# Patient Record
Sex: Female | Born: 2000 | Race: White | Hispanic: No | Marital: Single | Smoking: Never smoker
Health system: Southern US, Community
[De-identification: ages and names within clinical notes are randomized; demographics above are authoritative.]

## PROBLEM LIST (undated history)

## (undated) DIAGNOSIS — F909 Attention-deficit hyperactivity disorder, unspecified type: Secondary | ICD-10-CM

## (undated) HISTORY — PX: RECONSTRUCTION OF NOSE: SHX2301

## (undated) HISTORY — DX: Attention-deficit hyperactivity disorder, unspecified type: F90.9

## (undated) HISTORY — PX: TONSILLECTOMY AND ADENOIDECTOMY: SHX28

---
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
## 2020-04-15 ENCOUNTER — Emergency Department (HOSPITAL_COMMUNITY)
Admission: EM | Admit: 2020-04-15 | Discharge: 2020-04-16 | Disposition: A | Discharge status: Home / Self Care | Payer: Medicaid Other | Attending: Emergency Medicine | Admitting: Emergency Medicine

## 2020-04-15 ENCOUNTER — Encounter (HOSPITAL_COMMUNITY): Payer: Self-pay | Admitting: Emergency Medicine

## 2020-04-15 ENCOUNTER — Emergency Department (HOSPITAL_COMMUNITY): Payer: Medicaid Other

## 2020-04-15 ENCOUNTER — Other Ambulatory Visit: Payer: Self-pay

## 2020-04-15 DIAGNOSIS — Z5321 Procedure and treatment not carried out due to patient leaving prior to being seen by health care provider: Secondary | ICD-10-CM | POA: Insufficient documentation

## 2020-04-15 DIAGNOSIS — M79646 Pain in unspecified finger(s): Secondary | ICD-10-CM | POA: Diagnosis present

## 2020-04-15 NOTE — ED Triage Notes (Signed)
Pt states she got her thump stock in between to tables while working. Some deformity noticed on her thumb and bruise.

## 2020-04-16 ENCOUNTER — Other Ambulatory Visit: Payer: Self-pay

## 2020-04-16 ENCOUNTER — Emergency Department (HOSPITAL_BASED_OUTPATIENT_CLINIC_OR_DEPARTMENT_OTHER)
Admission: EM | Admit: 2020-04-16 | Discharge: 2020-04-16 | Disposition: A | Discharge status: Home / Self Care | Payer: Medicaid Other | Source: Home / Self Care | Attending: Emergency Medicine | Admitting: Emergency Medicine

## 2020-04-16 ENCOUNTER — Encounter (HOSPITAL_BASED_OUTPATIENT_CLINIC_OR_DEPARTMENT_OTHER): Payer: Self-pay

## 2020-04-16 DIAGNOSIS — S62524A Nondisplaced fracture of distal phalanx of right thumb, initial encounter for closed fracture: Secondary | ICD-10-CM

## 2020-04-16 DIAGNOSIS — W208XXA Other cause of strike by thrown, projected or falling object, initial encounter: Secondary | ICD-10-CM | POA: Insufficient documentation

## 2020-04-16 DIAGNOSIS — Y999 Unspecified external cause status: Secondary | ICD-10-CM | POA: Insufficient documentation

## 2020-04-16 DIAGNOSIS — Y9389 Activity, other specified: Secondary | ICD-10-CM | POA: Insufficient documentation

## 2020-04-16 DIAGNOSIS — Y929 Unspecified place or not applicable: Secondary | ICD-10-CM | POA: Insufficient documentation

## 2020-04-16 DIAGNOSIS — Z23 Encounter for immunization: Secondary | ICD-10-CM | POA: Insufficient documentation

## 2020-04-16 MED ORDER — BACITRACIN ZINC 500 UNIT/GM EX OINT
TOPICAL_OINTMENT | Freq: Two times a day (BID) | CUTANEOUS | Status: AC
  Administered 2020-04-16: 1 via TOPICAL
  Filled 2020-04-16: qty 28.35

## 2020-04-16 MED ORDER — IBUPROFEN 800 MG PO TABS
800.0000 mg | ORAL_TABLET | Freq: Once | ORAL | Status: AC
  Administered 2020-04-16: 10:00:00 800 mg via ORAL
  Filled 2020-04-16: qty 1

## 2020-04-16 MED ORDER — TETANUS-DIPHTH-ACELL PERTUSSIS 5-2.5-18.5 LF-MCG/0.5 IM SUSP
0.5000 mL | Freq: Once | INTRAMUSCULAR | Status: AC
  Administered 2020-04-16: 0.5 mL via INTRAMUSCULAR
  Filled 2020-04-16: qty 0.5

## 2020-04-16 NOTE — ED Provider Notes (Signed)
[facility] EMERGENCY DEPARTMENT Provider Note   CSN: [id number] Arrival date & time: 04/16/20  4166     History Chief Complaint  Patient presents with  . Hand Injury    Valerie Hughes is a 19 y.o. female.  19 year old female presents with complaint of right thumb injury. Patient states that she was at work yesterday setting up a table when the table fell and pinned her thumb between the table and the leg of the table. Injury occurred around 11:00 yesterday morning, states she was out of town so she waited to come to local ER on arrival back home last evening, extensive wait at [facility], ER and left after her x-ray was done. Patient reports pain improved with Coban splinting from the ER last night, is throbbing in nature, better with ibuprofen and elevating her hand. Patient is right-hand dominant. Reports pain and swelling to the distal thumb with small injury near her cuticle, last tetanus unknown. No other complaints or concerns today.        History reviewed. No pertinent past medical history.  There are no problems to display for this patient.   History reviewed. No pertinent surgical history.   OB History   No obstetric history on file.     No family history on file.  Social History   Tobacco Use  . Smoking status: Never Smoker  . Smokeless tobacco: Never Used  Substance Use Topics  . Alcohol use: Never  . Drug use: Never    Home Medications Prior to Admission medications   Not on File    Allergies    Patient has no known allergies.  Review of Systems   Review of Systems  Constitutional: Negative for fever.  Musculoskeletal: Positive for arthralgias, joint swelling and myalgias.  Skin: Positive for wound.  Allergic/Immunologic: Negative for immunocompromised state.  Neurological: Negative for weakness and numbness.  Hematological: Does not bruise/bleed easily.  Psychiatric/Behavioral: Negative for confusion.    Physical Exam Updated  Vital Signs BP 110/79 (BP Location: Left Arm)   Pulse 82   Temp 98 F (36.7 C) (Oral)   Resp 16   Ht 5\' 4"  (1.626 m)   Wt 61.2 kg   LMP 04/07/2020   SpO2 100%   BMI 23.17 kg/m   Physical Exam Vitals and nursing note reviewed.  Constitutional:      General: She is not in acute distress.    Appearance: She is well-developed. She is not diaphoretic.  HENT:     Head: Normocephalic and atraumatic.  Cardiovascular:     Pulses: Normal pulses.  Pulmonary:     Effort: Pulmonary effort is normal.  Musculoskeletal:        General: Swelling, tenderness and signs of injury present. No deformity.     Right hand: Swelling and tenderness present. No deformity. Decreased range of motion. Normal sensation. Normal capillary refill. Normal pulse.     Comments: Swelling and ecchymosis to distal right thumb with small defect/wound just proximal to the cuticle, no active bleeding. No nail bed injury.  Skin:    General: Skin is warm and dry.     Capillary Refill: Capillary refill takes less than 2 seconds.     Findings: Bruising present. No erythema or rash.  Neurological:     Mental Status: She is alert and oriented to person, place, and time.     Sensory: No sensory deficit.  Psychiatric:        Behavior: Behavior normal.  ED Results / Procedures / Treatments   Labs (all labs ordered are listed, but only abnormal results are displayed) Labs Reviewed - No data to display  EKG None  Radiology DG Hand Complete Right  Result Date: 04/15/2020 CLINICAL DATA:  Status post trauma. EXAM: RIGHT HAND - COMPLETE 3+ VIEW COMPARISON:  None. FINDINGS: An acute nondisplaced fracture deformity is seen involving the distal phalanx of the right thumb. There is no evidence of associated dislocation. There is no evidence of arthropathy or other focal bone abnormality. Mild soft tissue swelling is seen surrounding the previously noted fracture site. IMPRESSION: Acute fracture of the distal phalanx of the  right thumb. Electronically Signed   By: Aram Candela M.D.   On: 04/15/2020 22:15   DG Finger Thumb Right  Result Date: 04/15/2020 CLINICAL DATA:  Status post trauma. EXAM: RIGHT THUMB 2+V COMPARISON:  None. FINDINGS: A nondisplaced fracture deformity is seen extending through the proximal portion of the distal phalanx of the right thumb. There is no evidence of dislocation. There is no evidence of arthropathy or other focal bone abnormality. Mild diffuse soft tissue swelling is noted. IMPRESSION: Nondisplaced fracture of the distal phalanx of the right thumb. Electronically Signed   By: Aram Candela M.D.   On: 04/15/2020 22:14    Procedures Procedures (including critical care time)  Medications Ordered in ED Medications  bacitracin ointment (1 application Topical Provided for home use 04/16/20 1003)  ibuprofen (ADVIL) tablet 800 mg (800 mg Oral Given 04/16/20 1004)  Tdap (BOOSTRIX) injection 0.5 mL (0.5 mLs Intramuscular Given 04/16/20 1005)    ED Course  I have reviewed the triage vital signs and the nursing notes.  Pertinent labs & imaging results that were available during my care of the patient were reviewed by me and considered in my medical decision making (see chart for details).  Clinical Course as of Apr 16 1022  Sun Apr 16, 2020  8106 19 year old female with right thumb injury which occurred yesterday around 55 AM.  Patient had an x-ray at [facility], ED yesterday which showed acute nondisplaced fracture of the distal phalanx of the right thumb.  No nailbed injury, does have small skin defect just proximal to the cuticle which was cleaned and dressed with bacitracin.  Tetanus was updated, patient was placed in a thumb spica splint and referred to orthopedics for follow-up, recommend ice and elevate, take Motrin and Tylenol for pain.   [LM]    Clinical Course User Index [LM] Alden Hipp   MDM Rules/Calculators/A&P                     SPLINT  APPLICATION Date/Time: 10:23 AM Authorized by: Jeannie Fend Consent: Verbal consent obtained. Risks and benefits: risks, benefits and alternatives were discussed Consent given by: patient Splint applied by: ER tech Location details: right thumb Splint type: thumb spica Supplies used: OCL, ACE, webril Post-procedure: The splinted body part was neurovascularly unchanged following the procedure. Patient tolerance: Patient tolerated the procedure well with no immediate complications.    Final Clinical Impression(s) / ED Diagnoses Final diagnoses:  Closed nondisplaced fracture of distal phalanx of right thumb, initial encounter    Rx / DC Orders ED Discharge Orders    None       Jeannie Fend, PA-C 04/16/20 1023    Jacalyn Lefevre, MD 04/16/20 1120

## 2020-04-16 NOTE — ED Notes (Signed)
No answer x3

## 2020-04-16 NOTE — Discharge Instructions (Signed)
Keep splint on until follow up with orthopedics.  Elevate and apply ice for 20 minutes at a time at least three times daily. Take Motrin, up to 800mg , three times daily as needed for pain. You can also take Tylenol 650mg  four times daily as needed. Follow up with orthopedics, referral given, call Monday to schedule an appointment.

## 2020-04-16 NOTE — ED Triage Notes (Signed)
Pt arrives with c/o right thumb pain after getting her hand stuck between a wooden table and wooden rail yesterday. Pt reports she was seen at [facility] but left because of the wait. Pt reports she did have Xrays done there, hand wrapped in gauze and coban, also reports she needs her tetanus updated.

## 2020-04-16 NOTE — ED Notes (Signed)
No answer x2 

## 2020-04-16 NOTE — ED Notes (Signed)
No answer for vitals recheck x1 

## 2021-05-17 IMAGING — CR DG HAND COMPLETE 3+V*R*
3 series · 3 of 3 positions shown · non-contrast
Comparison: None.

CLINICAL DATA: Status post trauma.

EXAM:
RIGHT HAND - COMPLETE 3+ VIEW

[hand pa]
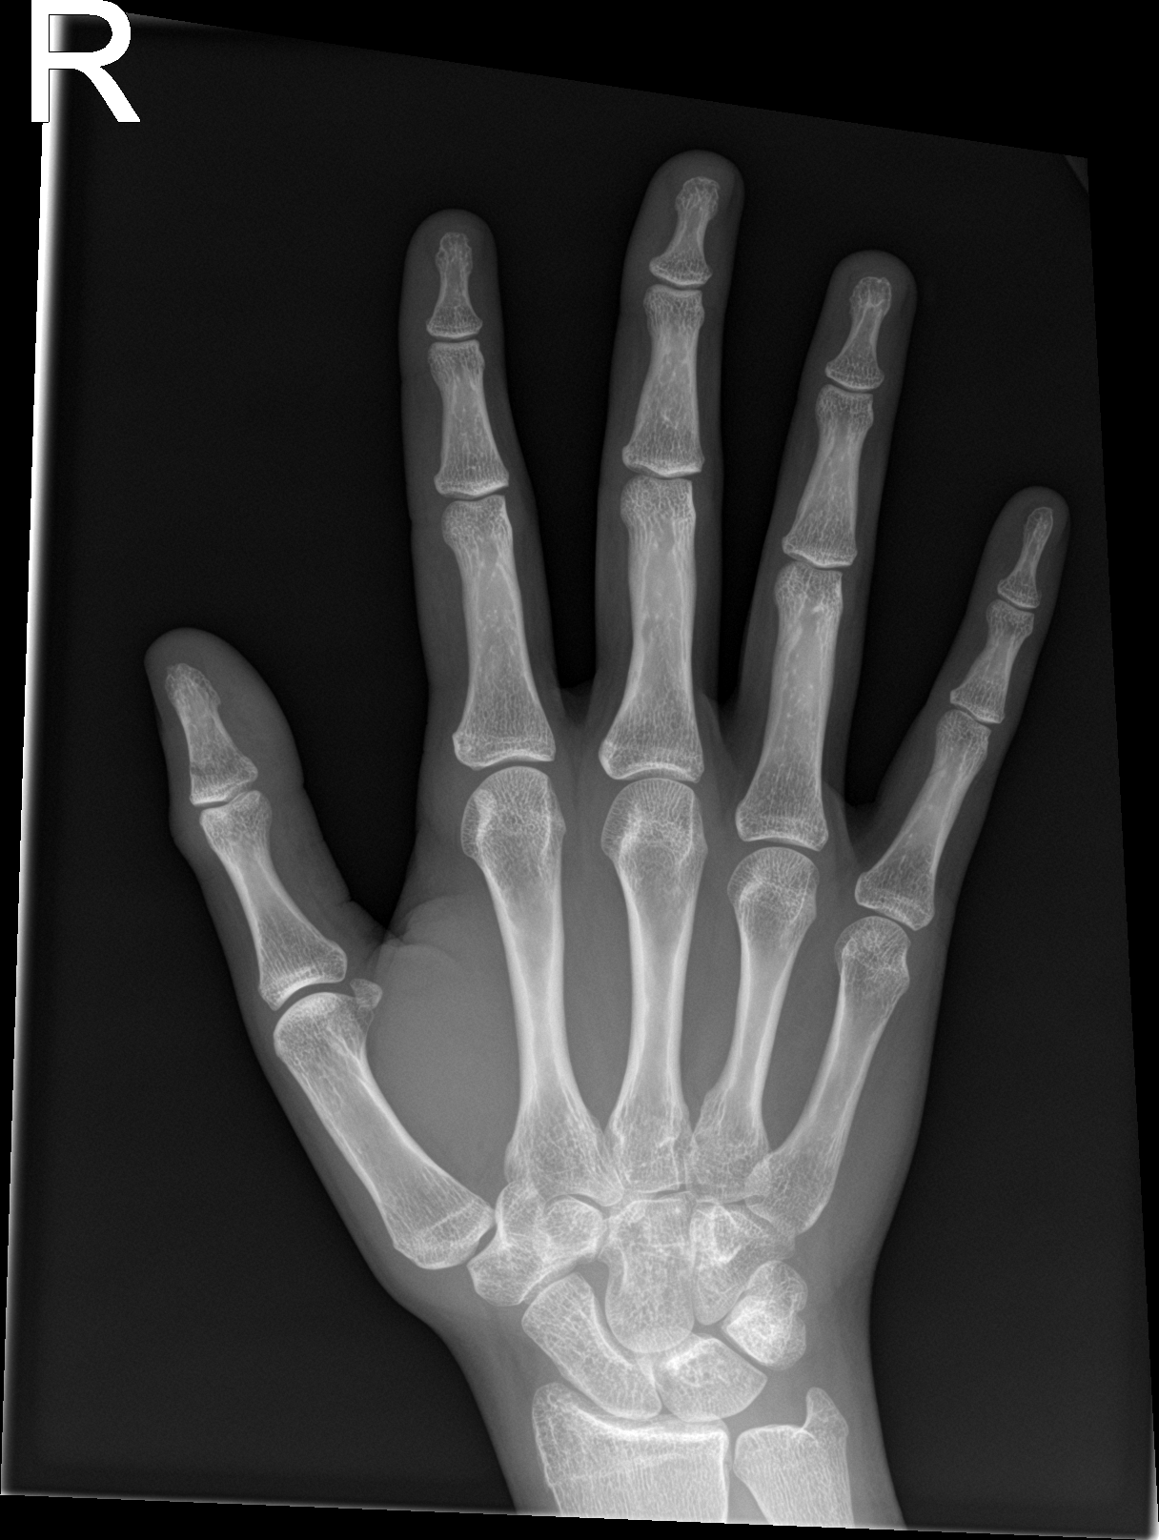

[hand obl]
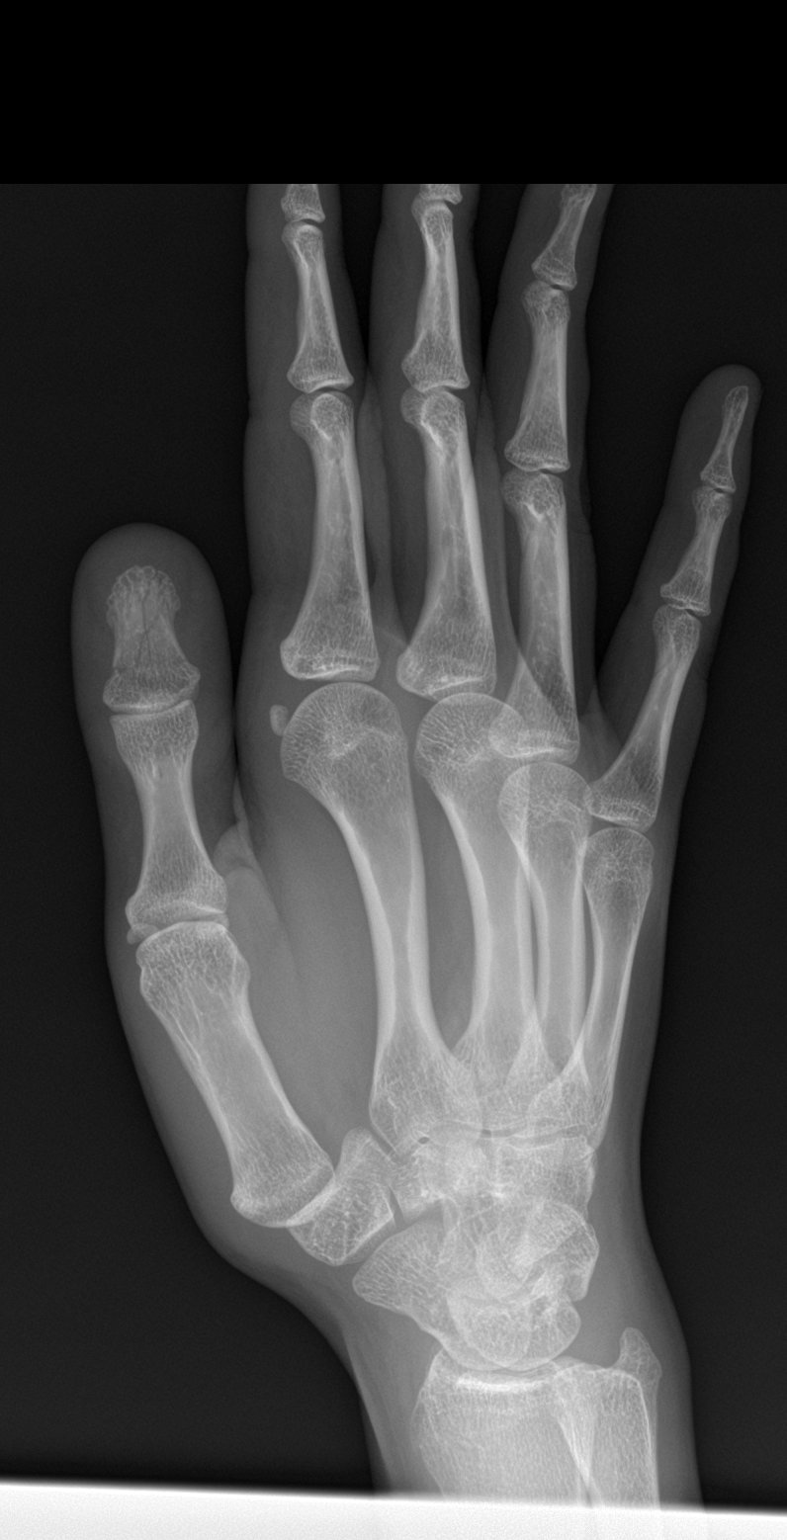

[hand lat]
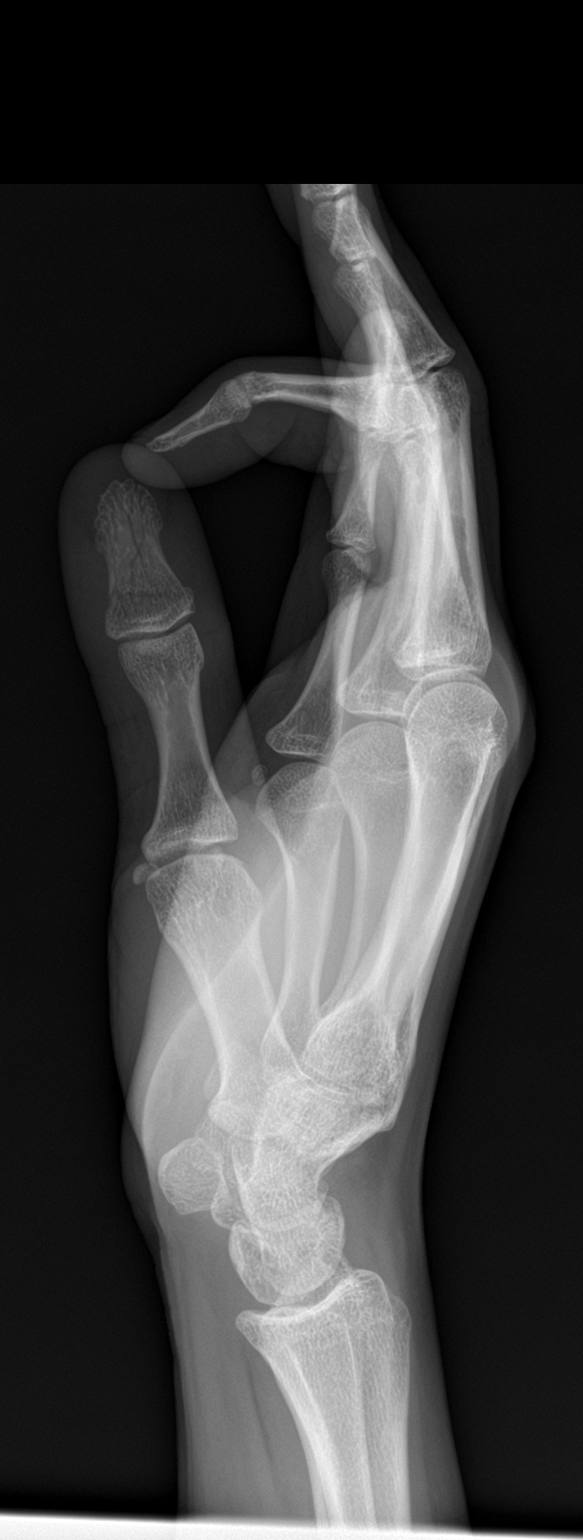

[3 of 3 positions shown; findings below may reference images not displayed]

FINDINGS: An acute nondisplaced fracture deformity is seen involving the
distal phalanx of the right thumb. There is no evidence of
associated dislocation. There is no evidence of arthropathy or other
focal bone abnormality. Mild soft tissue swelling is seen
surrounding the previously noted fracture site.
IMPRESSION: Acute fracture of the distal phalanx of the right thumb.

## 2021-05-17 IMAGING — CR DG FINGER THUMB 2+V*R*
3 series · 3 of 3 positions shown · non-contrast
Comparison: None.

CLINICAL DATA: Status post trauma.

EXAM:
RIGHT THUMB 2+V

[finger obl]
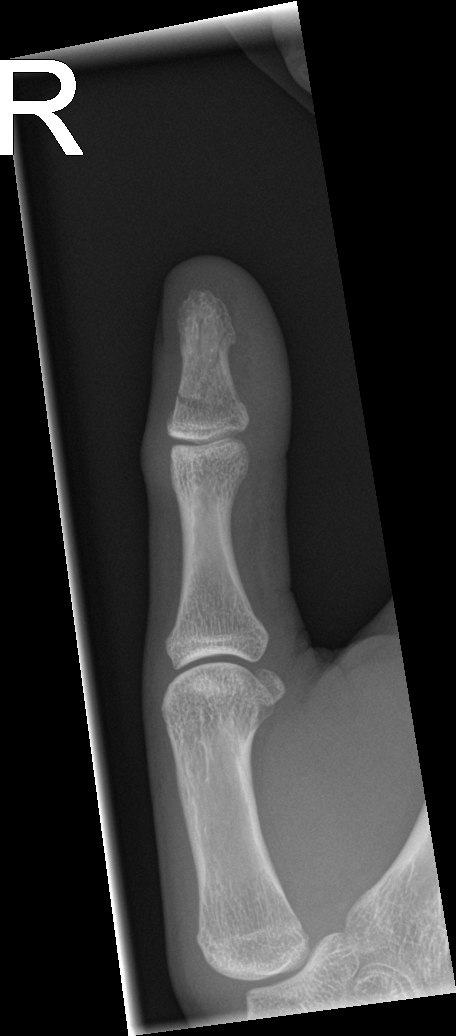

[finger lat]
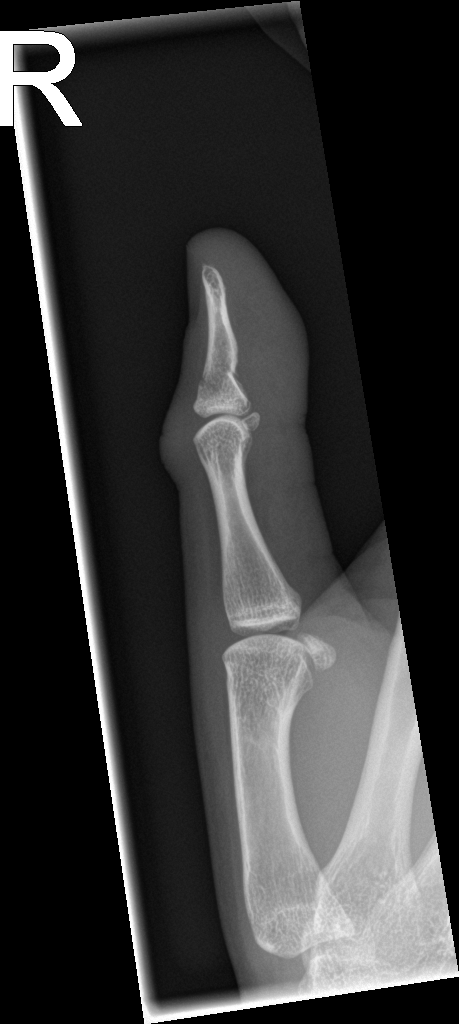

[finger ap]
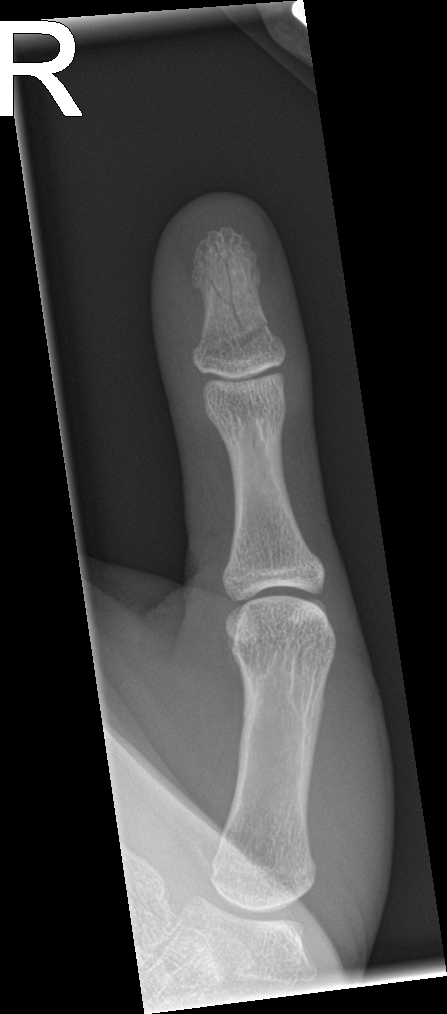

[3 of 3 positions shown; findings below may reference images not displayed]

FINDINGS: A nondisplaced fracture deformity is seen extending through the
proximal portion of the distal phalanx of the right thumb. There is
no evidence of dislocation. There is no evidence of arthropathy or
other focal bone abnormality. Mild diffuse soft tissue swelling is
noted.
IMPRESSION: Nondisplaced fracture of the distal phalanx of the right thumb.

## 2023-06-11 ENCOUNTER — Other Ambulatory Visit (HOSPITAL_COMMUNITY)
Admission: RE | Admit: 2023-06-11 | Discharge: 2023-06-11 | Disposition: A | Discharge status: Home / Self Care | Payer: Medicaid Other | Source: Ambulatory Visit | Attending: Obstetrics and Gynecology | Admitting: Obstetrics and Gynecology

## 2023-06-11 ENCOUNTER — Ambulatory Visit (INDEPENDENT_AMBULATORY_CARE_PROVIDER_SITE_OTHER): Payer: Medicaid Other | Admitting: Obstetrics and Gynecology

## 2023-06-11 ENCOUNTER — Encounter: Payer: Self-pay | Admitting: Obstetrics and Gynecology

## 2023-06-11 VITALS — BP 131/81 | HR 113 | Ht 63.0 in | Wt 155.2 lb

## 2023-06-11 DIAGNOSIS — Z124 Encounter for screening for malignant neoplasm of cervix: Secondary | ICD-10-CM

## 2023-06-11 DIAGNOSIS — Z113 Encounter for screening for infections with a predominantly sexual mode of transmission: Secondary | ICD-10-CM | POA: Diagnosis present

## 2023-06-11 NOTE — Progress Notes (Signed)
NGYN presents for AEX.  Requesting STD testing. Declines BC.

## 2023-06-11 NOTE — Progress Notes (Signed)
ANNUAL EXAM Patient name: Valerie Hughes MRN [id number]  Date of birth: 2001/10/04 Chief Complaint:   Establish care, routine screen  History of Present Illness:   Valerie Hughes is a 22 y.o. G0P0000  female being seen today for a routine annual exam.  Current complaints: no complaints. Has been using OCP for birth control. Stopped one month ago, reports she wanted to take a break. Has been using condoms in the mean time. Plans to go back on OCP in the next month. Having regular monthly cycles. Declines pelvic pain, urinary symptoms or problems with breasts.   Patient's last menstrual period was 05/27/2023.   The pregnancy intention screening data noted above was reviewed. Potential methods of contraception were discussed. The patient elected to proceed with condoms for now, will restart OCP  Last pap n/a. Results were: N/A. H/O abnormal pap: no Last mammogram: n/a. Results were: N/A. Family h/o breast cancer: no Last colonoscopy: n/a. Results were: N/A. Family h/o colorectal cancer: no      No data to display               No data to display           Review of Systems:   Pertinent items are noted in HPI Denies any headaches, blurred vision, fatigue, shortness of breath, chest pain, abdominal pain, abnormal vaginal discharge/itching/odor/irritation, problems with periods, bowel movements, urination, or intercourse unless otherwise stated above. Pertinent History Reviewed:  Reviewed past medical,surgical, social and family history.  Reviewed problem list, medications and allergies. Physical Assessment:   Vitals:   06/11/23 1517  BP: 131/81  Pulse: (!) 113  Weight: 155 lb 3.2 oz (70.4 kg)  Height: 5\' 3"  (1.6 m)  Body mass index is 27.49 kg/m.        Physical Examination:   General appearance - well appearing, and in no distress  Mental status - alert, oriented to person, place, and time  Psych:  She has a normal mood and affect  Skin - warm and dry, normal color, no  suspicious lesions noted  Chest - effort normal,   Heart - normal rate and regular rhythm  Neck:  midline trachea, no thyromegaly or nodules  Abdomen - soft, nontender, nondistended, no masses or organomegaly  Pelvic - VULVA: normal appearing vulva with no masses, tenderness or lesions  VAGINA: normal appearing vagina with normal color and discharge, no lesions  CERVIX: normal appearing cervix without discharge or lesions, no CMT  Thin prep pap is done   UTERUS: uterus is felt to be normal size, shape, consistency and nontender   ADNEXA: No adnexal masses or tenderness noted.    Extremities:  No swelling or varicosities noted  Chaperone present for exam  No results found for this or any previous visit (from the past 24 hour(s)).  Assessment & Plan:  1. Cervical cancer screening  - Cytology - PAP( [place])  2. Screen for STD (sexually transmitted disease) Collected today - Cervicovaginal ancillary only( [place]) - HIV antibody (with reflex) - RPR - Hepatitis B Surface AntiGEN - Hepatitis C Antibody   Labs/procedures today:   Mammogram: @ 22yo, or sooner if problems Colonoscopy: @ 22yo, or sooner if problems  Orders Placed This Encounter  Procedures   HIV antibody (with reflex)   RPR   Hepatitis B Surface AntiGEN   Hepatitis C Antibody    Meds: No orders of the defined types were placed in this encounter.   Follow-up: Return in one year  for annual exam   Albertine Grates, FNP

## 2023-06-12 LAB — CERVICOVAGINAL ANCILLARY ONLY
Chlamydia: NEGATIVE
Comment: NEGATIVE
Comment: NEGATIVE
Comment: NORMAL
Neisseria Gonorrhea: NEGATIVE
Trichomonas: NEGATIVE

## 2023-06-12 LAB — RPR: RPR Ser Ql: NONREACTIVE

## 2023-06-12 LAB — HEPATITIS C ANTIBODY: Hep C Virus Ab: NONREACTIVE

## 2023-06-12 LAB — HEPATITIS B SURFACE ANTIGEN: Hepatitis B Surface Ag: NEGATIVE

## 2023-06-12 LAB — HIV ANTIBODY (ROUTINE TESTING W REFLEX): HIV Screen 4th Generation wRfx: NONREACTIVE

## 2023-06-17 LAB — CYTOLOGY - PAP
Adequacy: ABSENT
Comment: NEGATIVE
Diagnosis: UNDETERMINED — AB
High risk HPV: NEGATIVE

## 2023-09-24 ENCOUNTER — Encounter: Payer: Self-pay | Admitting: Obstetrics and Gynecology

## 2023-09-24 ENCOUNTER — Ambulatory Visit: Payer: Medicaid Other | Admitting: Obstetrics and Gynecology

## 2023-09-24 VITALS — BP 112/72 | HR 66 | Ht 63.0 in | Wt 158.0 lb

## 2023-09-24 DIAGNOSIS — Z3169 Encounter for other general counseling and advice on procreation: Secondary | ICD-10-CM

## 2023-09-24 MED ORDER — PRENATAL PLUS 27-1 MG PO TABS: 1.0000 | ORAL_TABLET | Freq: Every day | ORAL | Status: AC

## 2023-09-24 NOTE — Progress Notes (Unsigned)
Pt c/o irregular period and is trying to conceive.

## 2023-09-24 NOTE — Progress Notes (Signed)
    GYN VISIT Patient name: Valerie Hughes MRN [id number]  Date of birth: 03-31-2001 Chief Complaint:   No chief complaint on file.  History of Present Illness:   Valerie Hughes is a 22 y.o. G0P0000  female being seen today to discuss future pregnancy. Accompanied by her supportive significant other. She stopped birth control pills around February. Reports her periods are mostly monthly, normal. She has skipped one month. She denies any current dysmenorrhea or menorrhagia, but reports the reason she started COC five years ago was due to incredibly painful periods. States she would sometimes miss 2 days of school due to intense cramping.    No LMP recorded. The current method of family planning is none.  Last pap June 2024, follow-up planned.       No data to display               No data to display           Review of Systems:   Pertinent items are noted in HPI Denies fever/chills, dizziness, headaches, visual disturbances, fatigue, shortness of breath, chest pain, abdominal pain, vomiting, abnormal vaginal discharge/itching/odor/irritation, problems with periods, bowel movements, urination, or intercourse unless otherwise stated above.  Pertinent History Reviewed:  Reviewed past medical,surgical, social, obstetrical and family history.  Reviewed problem list, medications and allergies. Physical Assessment:   Vitals:   09/24/23 1559  BP: 112/72  Pulse: 66  Weight: 158 lb (71.7 kg)  Height: 5\' 3"  (1.6 m)  Body mass index is 27.99 kg/m.       Physical Examination:   General appearance: alert, well appearing, and in no distress  Mental status: alert, oriented to person, place, and time  Skin: warm & dry   Cardiovascular: normal heart rate noted  Respiratory: normal respiratory effort, no distress  Abdomen: soft, non-tender   Pelvic: examination not indicated  Extremities: no edema    No results found for this or any previous visit (from the past 24 hour(s)).  Assessment  & Plan:   Encounter for Preconception counseling -Start Prenatal Vitamin daily -Pt will track periods and attempt ovulation predictor kits  RTO as scheduled for next annual gyn exam and prn if issues arise. Toma Aran Tarae Wooden    No follow-ups on file.  Letta Kocher CNM, WHNP-BC 09/24/2023 4:53 PM

## 2023-12-17 NOTE — L&D Delivery Note (Addendum)
 OB/GYN Faculty Practice Delivery Note  Valerie Hughes is a 23 y.o. G2P0010 s/p NSVD at [redacted]w[redacted]d. She was admitted for [facility] labor.   ROM: 30h 54m with meconium stained fluid GBS Status: negative Maximum Maternal Temperature: 100.63F  Labor Progress: Labor augmented with foley balloon and Pitocin .  Delivery Date/Time: 09/11/24 @ 9365 Delivery: Called to room and patient was complete and pushing with RN at bedside. NICU called for delivery due to meconium stained fluid and fetal tachycardia. Pushed for short time with SNM. Head delivered ROA. No nuchal cord present. Shoulder and body delivered in usual fashion. Infant with spontaneous cry, placed on mother's abdomen, dried and stimulated. Cord clamped x 2 after 1-minute delay, and cut by FOB. Cord blood collected. Brisk bleeding seen immediately after delivery. Placenta delivered quickly, spontaneously, intact, with 3-vessel cord. Fundus firm with massage and Pitocin , but bleeding continued. Labia, perineum, and vagina inspected, no laceration seen. I & O catheter done. TXA started. Uterus remained firm. Concern for cervical laceration. Dr. Ozan called to bedside. Cervix inspected by Valerie Hughes, CNM. Ring forceps applied to cervix and bleeding slowed. Dr. Ozan then assumed care of patient and repaired cervical laceration. Bleeding resolved.   Placenta: intact, to pathology Complications: chorioamnionitis  Lacerations: cervical laceration EBL: Analgesia: Epidural  Infant: female  APGARs 8,9  3900g  Valerie Hughes, [facility] 09/11/2024 8:43 AM       Midwife attestation: I was gloved and present for delivery in its entirety and I agree with the above resident's note.  Valerie Hughes, CNM 10:15 AM

## 2024-01-02 ENCOUNTER — Ambulatory Visit: Payer: Medicaid Other | Admitting: General Practice

## 2024-01-02 VITALS — BP 119/74 | HR 83 | Ht 63.0 in | Wt 161.0 lb

## 2024-01-02 DIAGNOSIS — Z3200 Encounter for pregnancy test, result unknown: Secondary | ICD-10-CM | POA: Diagnosis not present

## 2024-01-02 DIAGNOSIS — Z3201 Encounter for pregnancy test, result positive: Secondary | ICD-10-CM

## 2024-01-02 DIAGNOSIS — Z3401 Encounter for supervision of normal first pregnancy, first trimester: Secondary | ICD-10-CM | POA: Insufficient documentation

## 2024-01-02 DIAGNOSIS — Z3402 Encounter for supervision of normal first pregnancy, second trimester: Secondary | ICD-10-CM | POA: Insufficient documentation

## 2024-01-02 LAB — POCT URINE PREGNANCY: Preg Test, Ur: POSITIVE — AB

## 2024-01-02 NOTE — Progress Notes (Signed)
Valerie Hughes presents today for UPT. She has no unusual complaints. LMP:11-28-23    OBJECTIVE: Appears well, in no apparent distress.  OB History     Gravida  0   Para  0   Term  0   Preterm  0   AB  0   Living  0      SAB  0   IAB  0   Ectopic  0   Multiple  0   Live Births  0          Home UPT Result: Positive x4  In-Office UPT result: Positive  I have reviewed the patient's medical, obstetrical, social, and family histories, and medications.   ASSESSMENT: Positive pregnancy test  PLAN Prenatal care to be completed at: [facility]

## 2024-01-05 NOTE — Progress Notes (Signed)
Patient was assessed and managed by nursing staff during this encounter. I have reviewed the chart and agree with the documentation and plan. I have also made any necessary editorial changes.  Warden Fillers, MD 01/05/2024 9:30 AM

## 2024-01-08 ENCOUNTER — Encounter: Payer: Self-pay | Admitting: Obstetrics & Gynecology

## 2024-01-18 ENCOUNTER — Encounter: Payer: Self-pay | Admitting: Obstetrics & Gynecology

## 2024-01-23 ENCOUNTER — Ambulatory Visit: Payer: Medicaid Other | Admitting: *Deleted

## 2024-01-23 ENCOUNTER — Other Ambulatory Visit (INDEPENDENT_AMBULATORY_CARE_PROVIDER_SITE_OTHER): Payer: Self-pay

## 2024-01-23 VITALS — BP 106/66 | HR 76 | Wt 160.8 lb

## 2024-01-23 DIAGNOSIS — Z3401 Encounter for supervision of normal first pregnancy, first trimester: Secondary | ICD-10-CM | POA: Diagnosis not present

## 2024-01-23 DIAGNOSIS — O3680X0 Pregnancy with inconclusive fetal viability, not applicable or unspecified: Secondary | ICD-10-CM

## 2024-01-23 DIAGNOSIS — Z3A01 Less than 8 weeks gestation of pregnancy: Secondary | ICD-10-CM | POA: Diagnosis not present

## 2024-01-23 MED ORDER — BLOOD PRESSURE KIT DEVI: 1.0000 | 0 refills | Status: DC

## 2024-01-23 NOTE — Progress Notes (Signed)
 New OB Intake  I connected with Valerie Hughes  on 01/23/24 at 10:15 AM EST by In Person Visit and verified that I am speaking with the correct person using two identifiers. Nurse is located at [facility] and pt is located at [place] .  I discussed the limitations, risks, security and privacy concerns of performing an evaluation and management service by telephone and the availability of in person appointments. I also discussed with the patient that there may be a patient responsible charge related to this service. The patient expressed understanding and agreed to proceed.  I explained I am completing New OB Intake today. We discussed EDD of 09/03/2024, by Last Menstrual Period. Pt is G1P0000. I reviewed her allergies, medications and Medical/Surgical/OB history.    Patient Active Problem List   Diagnosis Date Noted   Encounter for supervision of normal first pregnancy in first trimester 01/02/2024   Encounter for preconception consultation 09/24/2023   ADD (attention deficit disorder) 08/20/2016    Concerns addressed today  Delivery Plans Plans to deliver at [facility]. Discussed the nature of our practice with multiple providers including residents and students. Due to the size of the practice, the delivering provider may not be the same as those providing prenatal care.   Patient is not interested in water birth. Offered upcoming OB visit with CNM to discuss further.  MyChart/Babyscripts MyChart access verified. I explained pt will have some visits in office and some virtually. Babyscripts instructions given and order placed. Patient verifies receipt of registration text/e-mail. Account successfully created and app downloaded. If patient is a candidate for Optimized scheduling, add to sticky note.   Blood Pressure Cuff/Weight Scale Blood pressure cuff ordered for patient to pick-up from [employer]. Explained after first prenatal appt pt will check weekly and document in Babyscripts.  Patient does not have weight scale; patient may purchase if they desire to track weight weekly in Babyscripts.  Anatomy US  Explained first scheduled US  will be around 19 weeks. Anatomy US  scheduled for TBD at TBD.  Interested in [place]? If yes, send referral and doula dot phrase.   Is patient a candidate for Babyscripts Optimization? Yes, patient accepted    First visit review I reviewed new OB appt with patient. Explained pt will be seen by Nidia Daring, NP at first visit. Discussed [place] genetic screening with patient. Requests Panorama and Horizon.. Routine prenatal labs  OB Urine only collected at today's visit.     Last Pap Diagnosis  Date Value Ref Range Status  06/11/2023 (A)  Final   - Atypical squamous cells of undetermined significance (ASC-US )    Rocky CHRISTELLA Ober, RN 01/23/2024  11:13 AM

## 2024-01-23 NOTE — Patient Instructions (Signed)

## 2024-01-25 LAB — CULTURE, OB URINE

## 2024-01-25 LAB — URINE CULTURE, OB REFLEX

## 2024-02-11 ENCOUNTER — Encounter: Payer: Medicaid Other | Admitting: Obstetrics & Gynecology

## 2024-02-23 ENCOUNTER — Encounter: Payer: Self-pay | Admitting: Obstetrics and Gynecology

## 2024-02-23 ENCOUNTER — Other Ambulatory Visit: Payer: Self-pay | Admitting: *Deleted

## 2024-02-23 ENCOUNTER — Other Ambulatory Visit (HOSPITAL_COMMUNITY)
Admission: RE | Admit: 2024-02-23 | Discharge: 2024-02-23 | Disposition: A | Discharge status: Home / Self Care | Source: Ambulatory Visit | Attending: Obstetrics and Gynecology | Admitting: Obstetrics and Gynecology

## 2024-02-23 ENCOUNTER — Ambulatory Visit (INDEPENDENT_AMBULATORY_CARE_PROVIDER_SITE_OTHER): Payer: Medicaid Other | Admitting: Obstetrics and Gynecology

## 2024-02-23 ENCOUNTER — Encounter: Payer: Self-pay | Admitting: *Deleted

## 2024-02-23 VITALS — BP 117/69 | HR 82 | Wt 168.8 lb

## 2024-02-23 DIAGNOSIS — Z3A11 11 weeks gestation of pregnancy: Secondary | ICD-10-CM | POA: Insufficient documentation

## 2024-02-23 DIAGNOSIS — Z3401 Encounter for supervision of normal first pregnancy, first trimester: Secondary | ICD-10-CM | POA: Diagnosis present

## 2024-02-23 DIAGNOSIS — F909 Attention-deficit hyperactivity disorder, unspecified type: Secondary | ICD-10-CM | POA: Diagnosis not present

## 2024-02-23 NOTE — Progress Notes (Signed)
 Pt presents for NOB visit. No concerns.

## 2024-02-23 NOTE — Patient Instructions (Signed)

## 2024-02-23 NOTE — Progress Notes (Addendum)
 INITIAL PRENATAL VISIT  Subjective:   Valerie Hughes is being seen today for her first obstetrical visit. She is at [redacted]w[redacted]d gestation by u/s Her obstetrical history is significant for  no . Relationship with FOB: significant other, living together. Patient does intend to breast feed. Pregnancy history fully reviewed.  Patient reports .  Indications for ASA therapy (per uptodate)  Two or more of the following: Nulliparity Yes Obesity (body mass index >30 kg/m2) No Family history of preeclampsia in mother or sister yes Age >=35 years No Sociodemographic characteristics (African American race, low socioeconomic level) No Personal risk factors (eg, previous pregnancy with low birth weight or small for gestational age infant, previous adverse pregnancy outcome [eg, stillbirth], interval >10 years between pregnancies) No  Objective:    Obstetric History OB History  Gravida Para Term Preterm AB Living  1 0 0 0 0 0  SAB IAB Ectopic Multiple Live Births  0 0 0 0 0    # Outcome Date GA Lbr Len/2nd Weight Sex Type Anes PTL Lv  1 Current             Past Medical History:  Diagnosis Date   ADHD     Past Surgical History:  Procedure Laterality Date   RECONSTRUCTION OF NOSE     TONSILLECTOMY AND ADENOIDECTOMY      Current Outpatient Medications on File Prior to Visit  Medication Sig Dispense Refill   amphetamine-dextroamphetamine (ADDERALL) 10 MG tablet Take 1 tablet by mouth daily.     cetirizine (ZYRTEC) 10 MG tablet Take 10 mg by mouth daily.     fluticasone (FLONASE) 50 MCG/ACT nasal spray Place 2 sprays into both nostrils daily.     prenatal vitamin w/FE, FA (PRENATAL 1 + 1) 27-1 MG TABS tablet Take 1 tablet by mouth daily at 12 noon. 100 tablet 06   amphetamine-dextroamphetamine (ADDERALL XR) 20 MG 24 hr capsule Take 20 mg by mouth daily. (Patient not taking: Reported on 02/23/2024)     amphetamine-dextroamphetamine (ADDERALL XR) 20 MG 24 hr capsule Take 1 capsule by mouth  daily. (Patient not taking: Reported on 02/23/2024)     Blood Pressure Monitoring (BLOOD PRESSURE KIT) DEVI 1 Device by Does not apply route once a week. (Patient not taking: Reported on 02/23/2024) 1 each 0   cetirizine (ZYRTEC) 10 MG tablet Take 1 tablet by mouth daily. (Patient not taking: Reported on 02/23/2024)     Ferrous Sulfate (IRON PO) Take 1 tablet by mouth daily. (Patient not taking: Reported on 02/23/2024)     hydrOXYzine (ATARAX) 10 MG tablet Take 25 mg by mouth 3 (three) times daily as needed. (Patient not taking: Reported on 02/23/2024)     No current facility-administered medications on file prior to visit.    Allergies  Allergen Reactions   Amoxicillin Hives    Social History:  reports that she has never smoked. She has never used smokeless tobacco. She reports that she does not drink alcohol and does not use drugs.  Family History  Problem Relation Age of Onset   Healthy Mother    Healthy Father    Diabetes Maternal Grandmother    Alzheimer's disease Paternal Grandfather     The following portions of the patient's history were reviewed and updated as appropriate: allergies, current medications, past family history, past medical history, past social history, past surgical history and problem list.  Review of Systems Review of Systems  All other systems reviewed and are negative.    Physical  Exam:  BP 117/69   Pulse 82   Wt 168 lb 12.8 oz (76.6 kg)   LMP 11/28/2023   BMI 29.90 kg/m  CONSTITUTIONAL: Well-developed, well-nourished female in no acute distress.  HENT:  Normocephalic, atraumatic.   EYES: Conjunctivae normal. NECK: Normal range of motion SKIN: Skin is warm and dry. MUSCULOSKELETAL: Normal range of motion NEUROLOGIC: Alert and oriented  PSYCHIATRIC: Normal mood and affect. Normal behavior.  RESPIRATORY: normal effort ABDOMEN: Soft PELVIC:  Fetal Heart Rate (bpm): 165   Movement: Absent       Assessment:    Pregnancy: G1P0000  1. Encounter  for supervision of normal first pregnancy in first trimester (Primary) 2. [redacted] weeks gestation of pregnancy BP and FHR normal Note provided for work to limit lifting to 20 lbs.  - Cervicovaginal ancillary only( [facility]) - CBC/D/Plt+RPR+Rh+ABO+RubIgG... - PANORAMA PRENATAL TEST - HORIZON Basic Panel - HgB A1c  3. Attention deficit hyperactivity disorder (ADHD), unspecified ADHD type She is not currently taking Adderall, discussed recommendation in pregnancy limited information and data regarding exposure/effect We discussed monitoring symptoms and discussed weighing the risk benefits if having significant symptoms without being on it.     Plan:     Initial labs drawn. Prenatal vitamins. Problem list reviewed and updated. Reviewed in detail the nature of the practice with collaborative care between  Genetic screening discussed: NIPS/First trimester screen/Quad/AFP ordered. Role of ultrasound in pregnancy discussed; Anatomy US: ordered. Follow up in 4 weeks. Discussed clinic routines, schedule of care and testing, genetic screening options, involvement of students and residents under the direct supervision of APPs and doctors and presence of female providers. Pt verbalized understanding.  Return in 4 weeks for routine prenatal     Sue Lush, FNP

## 2024-02-24 LAB — CERVICOVAGINAL ANCILLARY ONLY
Chlamydia: NEGATIVE
Comment: NEGATIVE
Comment: NEGATIVE
Comment: NORMAL
Neisseria Gonorrhea: NEGATIVE
Trichomonas: NEGATIVE

## 2024-02-25 LAB — CBC/D/PLT+RPR+RH+ABO+RUBIGG...
Antibody Screen: NEGATIVE
Basophils Absolute: 0 10*3/uL (ref 0.0–0.2)
Basos: 0 %
EOS (ABSOLUTE): 0.1 10*3/uL (ref 0.0–0.4)
Eos: 1 %
HCV Ab: NONREACTIVE
HIV Screen 4th Generation wRfx: NONREACTIVE
Hematocrit: 41.7 % (ref 34.0–46.6)
Hemoglobin: 13.8 g/dL (ref 11.1–15.9)
Hepatitis B Surface Ag: NEGATIVE
Immature Grans (Abs): 0.1 10*3/uL (ref 0.0–0.1)
Immature Granulocytes: 1 %
Lymphocytes Absolute: 2.5 10*3/uL (ref 0.7–3.1)
Lymphs: 22 %
MCH: 32.5 pg (ref 26.6–33.0)
MCHC: 33.1 g/dL (ref 31.5–35.7)
MCV: 98 fL — ABNORMAL HIGH (ref 79–97)
Monocytes Absolute: 0.7 10*3/uL (ref 0.1–0.9)
Monocytes: 6 %
Neutrophils Absolute: 7.7 10*3/uL — ABNORMAL HIGH (ref 1.4–7.0)
Neutrophils: 70 %
Platelets: 202 10*3/uL (ref 150–450)
RBC: 4.25 x10E6/uL (ref 3.77–5.28)
RDW: 12.3 % (ref 11.7–15.4)
RPR Ser Ql: NONREACTIVE
Rh Factor: POSITIVE
Rubella Antibodies, IGG: 3.74 {index} (ref >0.99)
WBC: 11 10*3/uL — ABNORMAL HIGH (ref 3.4–10.8)

## 2024-02-25 LAB — HEMOGLOBIN A1C
Est. average glucose Bld gHb Est-mCnc: 97 mg/dL
Hgb A1c MFr Bld: 5 % (ref 4.8–5.6)

## 2024-02-25 LAB — HCV INTERPRETATION

## 2024-02-29 LAB — PANORAMA PRENATAL TEST FULL PANEL:PANORAMA TEST PLUS 5 ADDITIONAL MICRODELETIONS: FETAL FRACTION: 11.1

## 2024-03-06 LAB — HORIZON CUSTOM: REPORT SUMMARY: NEGATIVE

## 2024-03-08 ENCOUNTER — Other Ambulatory Visit: Payer: Self-pay

## 2024-03-08 MED ORDER — ASPIRIN 81 MG PO TBEC: 81.0000 mg | DELAYED_RELEASE_TABLET | Freq: Every day | ORAL | 11 refills | Status: DC

## 2024-03-08 NOTE — Progress Notes (Signed)
 Aspirin rx sent per Albertine Grates, FNP

## 2024-03-22 ENCOUNTER — Encounter: Admitting: Obstetrics and Gynecology

## 2024-03-23 ENCOUNTER — Ambulatory Visit (INDEPENDENT_AMBULATORY_CARE_PROVIDER_SITE_OTHER): Admitting: Obstetrics and Gynecology

## 2024-03-23 VITALS — BP 113/67 | HR 102 | Wt 174.2 lb

## 2024-03-23 DIAGNOSIS — Z3A15 15 weeks gestation of pregnancy: Secondary | ICD-10-CM | POA: Diagnosis not present

## 2024-03-23 DIAGNOSIS — Z3402 Encounter for supervision of normal first pregnancy, second trimester: Secondary | ICD-10-CM | POA: Diagnosis not present

## 2024-03-23 NOTE — Progress Notes (Signed)
 Pt presents for rob. Pt has no questions or concerns at this time.

## 2024-03-23 NOTE — Progress Notes (Signed)
   PRENATAL VISIT NOTE  Subjective:  Valerie Hughes is a 23 y.o. G2P0010 at [redacted]w[redacted]d being seen today for ongoing prenatal care.  She is currently monitored for the following issues for this low-risk pregnancy and has Encounter for supervision of normal first pregnancy in second trimester and ADD (attention deficit disorder) on their problem list.  Patient doing well with no acute concerns today. She reports no complaints.  Contractions: Not present. Vag. Bleeding: None.  Movement: Present. Denies leaking of fluid.   The following portions of the patient's history were reviewed and updated as appropriate: allergies, current medications, past family history, past medical history, past social history, past surgical history and problem list. Problem list updated.  Objective:   Vitals:   03/23/24 1347  BP: 113/67  Pulse: (!) 102  Weight: 174 lb 3.2 oz (79 kg)    Fetal Status: Fetal Heart Rate (bpm): 156 Fundal Height: 15 cm Movement: Present     General:  Alert, oriented and cooperative. Patient is in no acute distress.  Skin: Skin is warm and dry. No rash noted.   Cardiovascular: Normal heart rate noted  Respiratory: Normal respiratory effort, no problems with respiration noted  Abdomen: Soft, gravid, appropriate for gestational age.  Pain/Pressure: Absent     Pelvic: Cervical exam deferred        Extremities: Normal range of motion.  Edema: Trace  Mental Status:  Normal mood and affect. Normal behavior. Normal judgment and thought content.   Assessment and Plan:  Pregnancy: G2P0010 at [redacted]w[redacted]d  1. Encounter for supervision of normal first pregnancy in second trimester (Primary) Continue routine prenatal care, will AFP at next visit  2. [redacted] weeks gestation of pregnancy   Preterm labor symptoms and general obstetric precautions including but not limited to vaginal bleeding, contractions, leaking of fluid and fetal movement were reviewed in detail with the patient.  Please refer to After  Visit Summary for other counseling recommendations.   Return in about 4 weeks (around 04/20/2024) for ROB, in person.   Mariel Aloe, MD Faculty Attending Center for [facility]

## 2024-04-19 ENCOUNTER — Ambulatory Visit: Payer: Medicaid Other | Admitting: *Deleted

## 2024-04-19 ENCOUNTER — Ambulatory Visit: Payer: Medicaid Other | Attending: Maternal & Fetal Medicine

## 2024-04-19 ENCOUNTER — Other Ambulatory Visit: Payer: Self-pay | Admitting: Obstetrics & Gynecology

## 2024-04-19 ENCOUNTER — Other Ambulatory Visit: Payer: Self-pay | Admitting: *Deleted

## 2024-04-19 ENCOUNTER — Ambulatory Visit (HOSPITAL_BASED_OUTPATIENT_CLINIC_OR_DEPARTMENT_OTHER): Admitting: Maternal & Fetal Medicine

## 2024-04-19 VITALS — BP 128/74 | HR 93

## 2024-04-19 DIAGNOSIS — O3680X0 Pregnancy with inconclusive fetal viability, not applicable or unspecified: Secondary | ICD-10-CM

## 2024-04-19 DIAGNOSIS — Z3401 Encounter for supervision of normal first pregnancy, first trimester: Secondary | ICD-10-CM

## 2024-04-19 DIAGNOSIS — O4402 Placenta previa specified as without hemorrhage, second trimester: Secondary | ICD-10-CM | POA: Insufficient documentation

## 2024-04-19 DIAGNOSIS — N949 Unspecified condition associated with female genital organs and menstrual cycle: Secondary | ICD-10-CM | POA: Diagnosis not present

## 2024-04-19 DIAGNOSIS — O3482 Maternal care for other abnormalities of pelvic organs, second trimester: Secondary | ICD-10-CM | POA: Insufficient documentation

## 2024-04-19 DIAGNOSIS — Z3402 Encounter for supervision of normal first pregnancy, second trimester: Secondary | ICD-10-CM

## 2024-04-19 DIAGNOSIS — O09892 Supervision of other high risk pregnancies, second trimester: Secondary | ICD-10-CM | POA: Diagnosis not present

## 2024-04-19 DIAGNOSIS — Z363 Encounter for antenatal screening for malformations: Secondary | ICD-10-CM | POA: Insufficient documentation

## 2024-04-19 DIAGNOSIS — Z3A19 19 weeks gestation of pregnancy: Secondary | ICD-10-CM | POA: Insufficient documentation

## 2024-04-19 DIAGNOSIS — O4403 Placenta previa specified as without hemorrhage, third trimester: Secondary | ICD-10-CM

## 2024-04-19 NOTE — Progress Notes (Unsigned)
   Patient information  Patient Name: Valerie Hughes  Patient MRN:   [id number]  Referring practice: {MFM Referring Provider:[id number]}  MFM CONSULT  Valerie Hughes is a 23 y.o. G2P0010 at [redacted]w[redacted]d here for ultrasound and consultation. Patient Active Problem List   Diagnosis Date Noted   Adnexal cyst 04/19/2024   Placenta previa without hemorrhage in second trimester 04/19/2024   Encounter for supervision of normal first pregnancy in second trimester 01/02/2024   ADD (attention deficit disorder) 08/20/2016    Valerie Hughes is doing well today with no acute concerns.  RE anterior previa: ***  RE adnexal cyst: ***  RE family history of preeclampsia: *** ***  There are limitations of prenatal ultrasound such as the inability to detect certain abnormalities due to poor visualization. Various factors such as fetal position, gestational age and maternal body habitus may increase the difficulty in visualizing the fetal anatomy.    Recommendations -Serial growth ultrasounds every 4-6 weeks until delivery -Antenatal testing to start around 32 weeks  -Delivery around *** weeks gestation  Review of Systems: A review of systems was performed and was negative except per HPI   Vitals and Physical Exam    04/19/2024    1:32 PM 03/23/2024    1:47 PM 02/23/2024   11:04 AM  Vitals with BMI  Weight  174 lbs 3 oz 168 lbs 13 oz  Systolic 128 113 782  Diastolic 74 67 69  Pulse 93 102 82    Sitting comfortably on the sonogram table Nonlabored breathing Normal rate and rhythm Abdomen is nontender  Past pregnancies OB History  Gravida Para Term Preterm AB Living  2 0 0 0 1 0  SAB IAB Ectopic Multiple Live Births  0 1 0 0 0    # Outcome Date GA Lbr Len/2nd Weight Sex Type Anes PTL Lv  2 Current           1 IAB 2018             I spent *** minutes reviewing the patients chart, including labs and images as well as counseling the patient about her medical conditions. Greater than 50% of the time was  spent in direct face-to-face patient counseling.  Valerie Hughes  MFM, [place]   04/19/2024  3:20 PM

## 2024-04-19 NOTE — Progress Notes (Signed)
 Patient information  Patient Name: Valerie Hughes  Patient MRN:   [id number]  Referring practice: MFM Referring Provider: [place] [facility]  MFM CONSULT  Valerie Hughes is a 23 y.o. G2P0010 at [redacted]w[redacted]d here for ultrasound and consultation. Patient Active Problem List   Diagnosis Date Noted   Adnexal cyst 04/19/2024   Placenta previa without hemorrhage in second trimester 04/19/2024   Encounter for supervision of normal first pregnancy in second trimester 01/02/2024   ADD (attention deficit disorder) 08/20/2016    Xayla Nedeau has a pregnancy with the complications mentioned in the problem list. During today's visit we focused on the following concerns:   RE low-lying placenta: I discussed the obstetric implications with this diagnosis.  Bleeding into the placenta just touches the internal os.  I encouraged the patient that this is likely to resolve prior to delivery.  She has not experienced significant vaginal bleeding this pregnancy for monitoring for new episodes of bleeding.  She knows to call her new provider for any concerns or go directly to the hospital with significant bleeding.  RE adnexal cyst: There is a simple cyst in the adnexal region with some internal debris. There are no septations or loculations.  This is most consistent with a paratubal cyst.  I discussed the possible risks of ovarian torsion and the need to monitor for symptoms of pain.  I also discussed the importance of post delivery follow-up with her OB provider.  Sonographic findings Single intrauterine pregnancy at 19w 2d. Fetal cardiac activity:  Observed and appears normal. Presentation: Cephalic. The anatomic structures that were well seen appear normal without evidence of soft markers. The anatomic survey is complete.  Fetal biometry shows the estimated fetal weight at the 85 percentile. Amniotic fluid: Within normal limits.    Placenta: Anterior previa. Adnexa: See Lt Ovary comment. Cervical length: 3.3  cm.  There are limitations of prenatal ultrasound such as the inability to detect certain abnormalities due to poor visualization. Various factors such as fetal position, gestational age and maternal body habitus may increase the difficulty in visualizing the fetal anatomy.    Recommendations -EDD should be 09/11/2024 based on  Early Ultrasound  (01/23/24). -Follow up ultrasound in 4-6 weeks to reassess the fetal growth and placenta location.  -Continue routine prenatal care with referring OB provider. -Bleeding precautions given.  Review of Systems: A review of systems was performed and was negative except per HPI   Vitals and Physical Exam    04/19/2024    1:32 PM 03/23/2024    1:47 PM 02/23/2024   11:04 AM  Vitals with BMI  Weight  174 lbs 3 oz 168 lbs 13 oz  Systolic 128 113 409  Diastolic 74 67 69  Pulse 93 102 82  Sitting comfortably on the sonogram table Nonlabored breathing Normal rate and rhythm Abdomen is nontender  Past pregnancies OB History  Gravida Para Term Preterm AB Living  2 0 0 0 1 0  SAB IAB Ectopic Multiple Live Births  0 1 0 0 0    # Outcome Date GA Lbr Len/2nd Weight Sex Type Anes PTL Lv  2 Current           1 IAB 2018             I spent 45 minutes reviewing the patients chart, including labs and images as well as counseling the patient about her medical conditions. Greater than 50% of the time was spent in direct face-to-face patient counseling.  Penney Bowling, DO Maternal fetal medicine, [place]   04/19/2024  3:55 PM

## 2024-04-20 ENCOUNTER — Encounter: Payer: Self-pay | Admitting: Advanced Practice Midwife

## 2024-04-20 ENCOUNTER — Encounter: Admitting: Obstetrics & Gynecology

## 2024-04-20 ENCOUNTER — Ambulatory Visit (INDEPENDENT_AMBULATORY_CARE_PROVIDER_SITE_OTHER): Admitting: Advanced Practice Midwife

## 2024-04-20 VITALS — BP 133/81 | HR 96 | Wt 182.6 lb

## 2024-04-20 DIAGNOSIS — Z3A19 19 weeks gestation of pregnancy: Secondary | ICD-10-CM | POA: Diagnosis not present

## 2024-04-20 DIAGNOSIS — Z3402 Encounter for supervision of normal first pregnancy, second trimester: Secondary | ICD-10-CM | POA: Diagnosis not present

## 2024-04-20 DIAGNOSIS — Z3401 Encounter for supervision of normal first pregnancy, first trimester: Secondary | ICD-10-CM

## 2024-04-20 NOTE — Progress Notes (Signed)
 Afp testing today.  Would like to discuss her birth plan.

## 2024-04-20 NOTE — Progress Notes (Signed)
   PRENATAL VISIT NOTE  Subjective:  Valerie Hughes is a 23 y.o. G2P0010 at [redacted]w[redacted]d being seen today for ongoing prenatal care.  She is currently monitored for the following issues for this low-risk pregnancy and has Encounter for supervision of normal first pregnancy in second trimester; ADD (attention deficit disorder); Adnexal cyst; and Placenta previa without hemorrhage in second trimester on their problem list.  Patient reports no complaints.  Contractions: Not present. Vag. Bleeding: None.  Movement: Present. Denies leaking of fluid.   The following portions of the patient's history were reviewed and updated as appropriate: allergies, current medications, past family history, past medical history, past social history, past surgical history and problem list.   Objective:   Vitals:   04/20/24 1324  BP: 133/81  Pulse: 96  Weight: 182 lb 9.6 oz (82.8 kg)    Fetal Status: Fetal Heart Rate (bpm): 153   Movement: Present     General:  Alert, oriented and cooperative. Patient is in no acute distress.  Skin: Skin is warm and dry. No rash noted.   Cardiovascular: Normal heart rate noted  Respiratory: Normal respiratory effort, no problems with respiration noted  Abdomen: Soft, gravid, appropriate for gestational age.  Pain/Pressure: Absent     Pelvic: Cervical exam deferred        Extremities: Normal range of motion.  Edema: None  Mental Status: Normal mood and affect. Normal behavior. Normal judgment and thought content.   Assessment and Plan:  Pregnancy: G2P0010 at [redacted]w[redacted]d 1. Encounter for supervision of normal first pregnancy in second trimester (Primary) --Anticipatory guidance about next visits/weeks of pregnancy given.  --Discussed safe travel in pregnancy, other pregnancy and labor and deliveyr questions answered  - AFP, Serum, Open Spina Bifida  2. [redacted] weeks gestation of pregnancy   Preterm labor symptoms and general obstetric precautions including but not limited to vaginal  bleeding, contractions, leaking of fluid and fetal movement were reviewed in detail with the patient. Please refer to After Visit Summary for other counseling recommendations.   No follow-ups on file.  Future Appointments  Date Time Provider Department Center  06/21/2024  3:00 PM [facility] PROVIDER 1 [facility]  06/21/2024  3:30 PM [facility] US2 [facility]    Arlester Bence, CNM

## 2024-04-22 LAB — AFP, SERUM, OPEN SPINA BIFIDA
AFP MoM: 1.42
AFP Value: 62.1 ng/mL
Gest. Age on Collection Date: 19 wk
Maternal Age At EDD: 23 a
OSBR Risk 1 IN: 3404
Test Results:: NEGATIVE
Weight: 182 [lb_av]

## 2024-05-18 ENCOUNTER — Encounter: Payer: Self-pay | Admitting: Advanced Practice Midwife

## 2024-05-18 ENCOUNTER — Ambulatory Visit (INDEPENDENT_AMBULATORY_CARE_PROVIDER_SITE_OTHER): Admitting: Advanced Practice Midwife

## 2024-05-18 VITALS — BP 121/77 | HR 84 | Wt 194.0 lb

## 2024-05-18 DIAGNOSIS — Z3A23 23 weeks gestation of pregnancy: Secondary | ICD-10-CM

## 2024-05-18 DIAGNOSIS — N949 Unspecified condition associated with female genital organs and menstrual cycle: Secondary | ICD-10-CM | POA: Diagnosis not present

## 2024-05-18 DIAGNOSIS — Z3480 Encounter for supervision of other normal pregnancy, unspecified trimester: Secondary | ICD-10-CM | POA: Diagnosis not present

## 2024-05-18 NOTE — Progress Notes (Signed)
   PRENATAL VISIT NOTE  Subjective:  Valerie Hughes is a 23 y.o. G2P0010 at [redacted]w[redacted]d being seen today for ongoing prenatal care.  She is currently monitored for the following issues for this low-risk pregnancy and has Encounter for supervision of normal first pregnancy in second trimester; ADD (attention deficit disorder); Adnexal cyst; and Placenta previa without hemorrhage in second trimester on their problem list.  Patient reports occasional sharp lower abdomen pain, especially when sneezing.  Contractions: Not present. Vag. Bleeding: None.  Movement: Present. Denies leaking of fluid.   The following portions of the patient's history were reviewed and updated as appropriate: allergies, current medications, past family history, past medical history, past social history, past surgical history and problem list.   Objective:    Vitals:   05/18/24 1556  BP: 121/77  Pulse: 84  Weight: 194 lb (88 kg)    Fetal Status:  Fetal Heart Rate (bpm): 153   Movement: Present    General: Alert, oriented and cooperative. Patient is in no acute distress.  Skin: Skin is warm and dry. No rash noted.   Cardiovascular: Normal heart rate noted  Respiratory: Normal respiratory effort, no problems with respiration noted  Abdomen: Soft, gravid, appropriate for gestational age.  Pain/Pressure: Present     Pelvic: Cervical exam deferred        Extremities: Normal range of motion.  Edema: Trace  Mental Status: Normal mood and affect. Normal behavior. Normal judgment and thought content.   Assessment and Plan:  Pregnancy: G2P0010 at [redacted]w[redacted]d 1. Supervision of other normal pregnancy, antepartum (Primary) --Anticipatory guidance about next visits/weeks of pregnancy given.  --Questions answered about GTT, scheduled for next visit  2. [redacted] weeks gestation of pregnancy   3. Round ligament pain --Rest/ice/heat/warm bath/increase PO fluids/Tylenol/pregnancy support belt    Preterm labor symptoms and general obstetric  precautions including but not limited to vaginal bleeding, contractions, leaking of fluid and fetal movement were reviewed in detail with the patient. Please refer to After Visit Summary for other counseling recommendations.   Return in about 4 weeks (around 06/15/2024) for GTT at next visit, LOB.  Future Appointments  Date Time Provider Department Center  06/16/2024  8:00 AM [facility] None  06/16/2024 10:15 AM Tresia Fruit, MD [facility] None  06/21/2024  3:00 PM [facility] PROVIDER 1 [facility]  06/21/2024  3:30 PM [facility] US2 [facility]    Arlester Bence, CNM

## 2024-05-18 NOTE — Progress Notes (Signed)
 Pt presents for ROB visit. No concerns

## 2024-06-01 ENCOUNTER — Encounter: Payer: Self-pay | Admitting: Advanced Practice Midwife

## 2024-06-16 ENCOUNTER — Ambulatory Visit: Payer: Self-pay | Admitting: Obstetrics & Gynecology

## 2024-06-16 ENCOUNTER — Other Ambulatory Visit

## 2024-06-16 ENCOUNTER — Encounter: Payer: Self-pay | Admitting: Obstetrics & Gynecology

## 2024-06-16 VITALS — BP 114/73 | HR 85 | Wt 198.0 lb

## 2024-06-16 DIAGNOSIS — Z3482 Encounter for supervision of other normal pregnancy, second trimester: Secondary | ICD-10-CM

## 2024-06-16 DIAGNOSIS — Z1331 Encounter for screening for depression: Secondary | ICD-10-CM

## 2024-06-16 DIAGNOSIS — Z3480 Encounter for supervision of other normal pregnancy, unspecified trimester: Secondary | ICD-10-CM

## 2024-06-16 DIAGNOSIS — Z3A27 27 weeks gestation of pregnancy: Secondary | ICD-10-CM | POA: Diagnosis not present

## 2024-06-16 DIAGNOSIS — Z3402 Encounter for supervision of normal first pregnancy, second trimester: Secondary | ICD-10-CM

## 2024-06-16 NOTE — Progress Notes (Signed)
   PRENATAL VISIT NOTE  Subjective:  Valerie Hughes is a 23 y.o. G2P0010 at [redacted]w[redacted]d being seen today for ongoing prenatal care.  She is currently monitored for the following issues for this high-risk pregnancy and has Encounter for supervision of normal first pregnancy in second trimester; ADD (attention deficit disorder); Adnexal cyst; and Placenta previa without hemorrhage in second trimester on their problem list.  Patient reports backache.  Contractions: Not present. Vag. Bleeding: None.  Movement: Present. Denies leaking of fluid.   The following portions of the patient's history were reviewed and updated as appropriate: allergies, current medications, past family history, past medical history, past social history, past surgical history and problem list.   Objective:    Vitals:   06/16/24 0849  BP: 114/73  Pulse: 85  Weight: 198 lb (89.8 kg)    Fetal Status:  Fetal Heart Rate (bpm): 141   Movement: Present    General: Alert, oriented and cooperative. Patient is in no acute distress.  Skin: Skin is warm and dry. No rash noted.   Cardiovascular: Normal heart rate noted  Respiratory: Normal respiratory effort, no problems with respiration noted  Abdomen: Soft, gravid, appropriate for gestational age.  Pain/Pressure: Present     Pelvic: Cervical exam deferred        Extremities: Normal range of motion.  Edema: Trace  Mental Status: Normal mood and affect. Normal behavior. Normal judgment and thought content.   Assessment and Plan:  Pregnancy: G2P0010 at [redacted]w[redacted]d 1. Supervision of other normal pregnancy, antepartum (Primary) [redacted]w[redacted]d  - Glucose Tolerance, 2 Hours w/1 Hour - CBC - RPR - HIV antibody (with reflex)  2. [redacted] weeks gestation of pregnancy  - Glucose Tolerance, 2 Hours w/1 Hour - CBC - RPR - HIV antibody (with reflex)  3. Encounter for supervision of normal first pregnancy in second trimester Back pain with some assymetry noted suspicious for mild scoliosis; she has  strong FH of scoliosis  Preterm labor symptoms and general obstetric precautions including but not limited to vaginal bleeding, contractions, leaking of fluid and fetal movement were reviewed in detail with the patient. Please refer to After Visit Summary for other counseling recommendations.   Return in about 4 weeks (around 07/14/2024).  Future Appointments  Date Time Provider Department Center  06/16/2024 10:15 AM Eveline Lynwood MATSU, MD [facility] None  06/21/2024  3:00 PM [facility] PROVIDER 1 [facility]  06/21/2024  3:30 PM [facility] US2 [facility]    Lynwood Eveline, MD

## 2024-06-16 NOTE — Progress Notes (Signed)
 ROB/GTT.  Declined TDAP.  C/o back pain.

## 2024-06-17 LAB — CBC
Hematocrit: 38.6 % (ref 34.0–46.6)
Hemoglobin: 12.7 g/dL (ref 11.1–15.9)
MCH: 32.6 pg (ref 26.6–33.0)
MCHC: 32.9 g/dL (ref 31.5–35.7)
MCV: 99 fL — ABNORMAL HIGH (ref 79–97)
Platelets: 157 10*3/uL (ref 150–450)
RBC: 3.89 x10E6/uL (ref 3.77–5.28)
RDW: 12.6 % (ref 11.7–15.4)
WBC: 12.1 10*3/uL — ABNORMAL HIGH (ref 3.4–10.8)

## 2024-06-17 LAB — RPR: RPR Ser Ql: NONREACTIVE

## 2024-06-17 LAB — GLUCOSE TOLERANCE, 2 HOURS W/ 1HR
Glucose, 1 hour: 142 mg/dL (ref 70–179)
Glucose, 2 hour: 103 mg/dL (ref 70–152)
Glucose, Fasting: 80 mg/dL (ref 70–91)

## 2024-06-17 LAB — HIV ANTIBODY (ROUTINE TESTING W REFLEX): HIV Screen 4th Generation wRfx: NONREACTIVE

## 2024-06-20 ENCOUNTER — Ambulatory Visit: Payer: Self-pay | Admitting: Obstetrics & Gynecology

## 2024-06-21 ENCOUNTER — Ambulatory Visit (HOSPITAL_BASED_OUTPATIENT_CLINIC_OR_DEPARTMENT_OTHER): Admitting: Obstetrics and Gynecology

## 2024-06-21 ENCOUNTER — Ambulatory Visit: Attending: Obstetrics and Gynecology

## 2024-06-21 VITALS — BP 131/74 | HR 91

## 2024-06-21 DIAGNOSIS — O4413 Placenta previa with hemorrhage, third trimester: Secondary | ICD-10-CM

## 2024-06-21 DIAGNOSIS — Z3A22 22 weeks gestation of pregnancy: Secondary | ICD-10-CM | POA: Insufficient documentation

## 2024-06-21 DIAGNOSIS — Z3402 Encounter for supervision of normal first pregnancy, second trimester: Secondary | ICD-10-CM | POA: Insufficient documentation

## 2024-06-21 DIAGNOSIS — Z3A28 28 weeks gestation of pregnancy: Secondary | ICD-10-CM | POA: Insufficient documentation

## 2024-06-21 DIAGNOSIS — Z7729 Contact with and (suspected ) exposure to other hazardous substances: Secondary | ICD-10-CM | POA: Diagnosis not present

## 2024-06-21 DIAGNOSIS — O99323 Drug use complicating pregnancy, third trimester: Secondary | ICD-10-CM

## 2024-06-21 DIAGNOSIS — O4403 Placenta previa specified as without hemorrhage, third trimester: Secondary | ICD-10-CM | POA: Diagnosis present

## 2024-06-21 NOTE — Progress Notes (Signed)
 After review, MFM consult with provider is not indicated for today  Arna Ranks, MD 06/21/2024 4:18 PM  [facility]

## 2024-07-14 ENCOUNTER — Ambulatory Visit (INDEPENDENT_AMBULATORY_CARE_PROVIDER_SITE_OTHER): Admitting: Physician Assistant

## 2024-07-14 ENCOUNTER — Encounter: Payer: Self-pay | Admitting: Physician Assistant

## 2024-07-14 VITALS — BP 130/80 | HR 88 | Wt 208.0 lb

## 2024-07-14 DIAGNOSIS — Z3403 Encounter for supervision of normal first pregnancy, third trimester: Secondary | ICD-10-CM | POA: Diagnosis not present

## 2024-07-14 DIAGNOSIS — Z3A31 31 weeks gestation of pregnancy: Secondary | ICD-10-CM

## 2024-07-14 NOTE — Progress Notes (Signed)
   PRENATAL VISIT NOTE  Subjective:  Valerie Hughes is a 23 y.o. G2P0010 at [redacted]w[redacted]d being seen today for ongoing prenatal care.  She is currently monitored for the following issues for this low-risk pregnancy and has Encounter for supervision of normal first pregnancy in second trimester; ADD (attention deficit disorder); Adnexal cyst; and Placenta previa without hemorrhage in second trimester on their problem list.  Patient reports no complaints.  Contractions: Not present. Vag. Bleeding: None.  Movement: Present. Denies leaking of fluid.   The following portions of the patient's history were reviewed and updated as appropriate: allergies, current medications, past family history, past medical history, past social history, past surgical history and problem list.   Objective:    Vitals:   07/14/24 1544  BP: 130/80  Pulse: 88  Weight: 208 lb (94.3 kg)    Fetal Status:  Fetal Heart Rate (bpm): 142 Fundal Height: 31 cm Movement: Present    General: Alert, oriented and cooperative. Patient is in no acute distress.  Skin: Skin is warm and dry. No rash noted.   Cardiovascular: Normal heart rate noted  Respiratory: Normal respiratory effort, no problems with respiration noted  Abdomen: Soft, gravid, appropriate for gestational age.  Pain/Pressure: Absent     Pelvic: Cervical exam deferred        Extremities: Normal range of motion.  Edema: Trace  Mental Status: Normal mood and affect. Normal behavior. Normal judgment and thought content.   Assessment and Plan:  Pregnancy: G2P0010 at [redacted]w[redacted]d  1. Encounter for supervision of normal first pregnancy in third trimester (Primary) Patient doing well, feeling regular fetal movement  BP, FHR, FH appropriate   2. [redacted] weeks gestation of pregnancy Anticipatory guidance about next visits/weeks of pregnancy given.   Preterm labor symptoms and general obstetric precautions including but not limited to vaginal bleeding, contractions, leaking of fluid and  fetal movement were reviewed in detail with the patient.  Please refer to After Visit Summary for other counseling recommendations.   Return in about 2 weeks (around 07/28/2024).  Future Appointments  Date Time Provider Department Center  07/28/2024  2:10 PM Aubert Choyce E, PA-C [facility] None    Cerinity Zynda E Addilee Neu, PA-C

## 2024-07-14 NOTE — Progress Notes (Signed)
 Pt presents for ROB visit. Pt c/o swelling in the ankles. Pt has concerns about restrictions in the 3rd trimester.

## 2024-07-28 ENCOUNTER — Encounter: Admitting: Physician Assistant

## 2024-08-03 ENCOUNTER — Encounter: Payer: Self-pay | Admitting: Physician Assistant

## 2024-08-04 ENCOUNTER — Ambulatory Visit (INDEPENDENT_AMBULATORY_CARE_PROVIDER_SITE_OTHER): Admitting: Obstetrics and Gynecology

## 2024-08-04 VITALS — BP 116/74 | HR 92 | Wt 212.0 lb

## 2024-08-04 DIAGNOSIS — M79661 Pain in right lower leg: Secondary | ICD-10-CM

## 2024-08-04 DIAGNOSIS — Z3403 Encounter for supervision of normal first pregnancy, third trimester: Secondary | ICD-10-CM | POA: Diagnosis not present

## 2024-08-04 MED ORDER — ENOXAPARIN SODIUM 150 MG/ML IJ SOSY
150.0000 mg | PREFILLED_SYRINGE | INTRAMUSCULAR | 0 refills | Status: DC
Start: 2024-08-04 — End: 2024-09-09

## 2024-08-04 NOTE — Progress Notes (Signed)
 ROB, wants knows the baby's weight.

## 2024-08-04 NOTE — Patient Instructions (Addendum)
 Your dose of blood thinner is based on your weight to make sure it thins your blood. The dose is  150 mg  for a one time injection  Important instructions for your upcoming vascular ultrasound:  You have been scheduled for an outpatient vascular study at [facility].  This is a very important study to have to help us  know if you need to continue the blood thinner your provider started you on today.   Please go the [facility] ([address] [place]) at 11:30 AM and report to the 1st floor.  Inform registration you are there for a vascular study.

## 2024-08-04 NOTE — Progress Notes (Signed)
   PRENATAL VISIT NOTE  Subjective:  Valerie Hughes is a 23 y.o. G2P0010 at [redacted]w[redacted]d being seen today for ongoing prenatal care.  She is currently monitored for the following issues for this low-risk pregnancy and has Encounter for supervision of normal first pregnancy in second trimester; ADD (attention deficit disorder); Adnexal cyst; and Placenta previa without hemorrhage in second trimester on their problem list.  Patient reports right calf/ankle swelling and pain/cramps in her right calf since returning from her grandfather's funeral two days ago. Drove from [place]. Reports that she wore compression socks the whole time..  Contractions: Not present. Vag. Bleeding: None.  Movement: Present. Denies leaking of fluid.   The following portions of the patient's history were reviewed and updated as appropriate: allergies, current medications, past family history, past medical history, past social history, past surgical history and problem list.   Objective:   Vitals:   08/04/24 1614  BP: 116/74  Pulse: 92  Weight: 212 lb (96.2 kg)   Body mass index is 37.55 kg/m. Total weight gain: 51 lb 3.2 oz (23.2 kg)   Fetal Status: Fetal Heart Rate (bpm): 138 Fundal Height: 34 cm Movement: Present     General:  Alert, oriented and cooperative. Patient is in no acute distress.  Skin: Skin is warm and dry. No rash noted.   Cardiovascular: Normal heart rate noted  Respiratory: Normal respiratory effort, no problems with respiration noted  Abdomen: Soft, gravid, appropriate for gestational age.  Pain/Pressure: Present     Pelvic: Cervical exam deferred        Extremities: Normal range of motion.  Edema: None Calves symmetric in size at 42-43 cm maximum diameter, no pain with exam, no erythema  Mental Status: Normal mood and affect. Normal behavior. Normal judgment and thought content.   Assessment and Plan:  Pregnancy: G2P0010 at [redacted]w[redacted]d 1. Encounter for supervision of normal first pregnancy in third  trimester (Primary) Anticipatory guidance  2. Right calf pain Significant risk factors given pregnancy, recent travel with long car ride and report of pain in right calf.  Recommend r/o DVT with LE dopplers, unable to do after hours Will prescribe lovenox  and have her f/u in the morning for dopplers. Scheduled for noon Lovenox  dosed for 24 hour dosing at 1.5 mg/kg for a total of 150 mg . Patient partner state he is comfortable with subcutaneous injections as he used to do this for his grandparent - VAS US  LOWER EXTREMITY VENOUS (DVT); Future - enoxaparin  (LOVENOX ) 150 MG/ML injection; Inject 1 mL (150 mg total) into the skin daily for 1 dose.  Dispense: 1 mL; Refill: 0   Preterm labor symptoms and general obstetric precautions including but not limited to vaginal bleeding, contractions, leaking of fluid and fetal movement were reviewed in detail with the patient. Please refer to After Visit Summary for other counseling recommendations.   Return in about 2 weeks (around 08/18/2024).  Future Appointments  Date Time Provider Department Center  08/05/2024 12:00 PM [facility]  08/23/2024  2:50 PM Valerie Nidia CROME, FNP [facility] None    Rollo Valerie Bring, MD

## 2024-08-05 ENCOUNTER — Ambulatory Visit (HOSPITAL_COMMUNITY): Admission: RE | Admit: 2024-08-05 | Source: Ambulatory Visit

## 2024-08-05 ENCOUNTER — Ambulatory Visit (HOSPITAL_COMMUNITY)
Admission: RE | Admit: 2024-08-05 | Discharge: 2024-08-05 | Disposition: A | Discharge status: Home / Self Care | Source: Ambulatory Visit | Attending: Obstetrics and Gynecology | Admitting: Obstetrics and Gynecology

## 2024-08-05 DIAGNOSIS — M79661 Pain in right lower leg: Secondary | ICD-10-CM | POA: Insufficient documentation

## 2024-08-05 NOTE — Telephone Encounter (Signed)
 Called patient to check in as her DVT dopplers was cancelled. She rescheduled for 430pm. Has not done her lovenox  yet but plans to later today after work. Patient encouraged to follow up as scheduled.  Rollo ONEIDA Bring, MD, FACOG Obstetrician & Gynecologist, [facility]

## 2024-08-06 ENCOUNTER — Ambulatory Visit: Payer: Self-pay | Admitting: Obstetrics and Gynecology

## 2024-08-11 ENCOUNTER — Encounter: Payer: Self-pay | Admitting: Obstetrics and Gynecology

## 2024-08-18 ENCOUNTER — Encounter: Payer: Self-pay | Admitting: Obstetrics and Gynecology

## 2024-08-23 ENCOUNTER — Encounter: Payer: Self-pay | Admitting: Obstetrics and Gynecology

## 2024-08-23 ENCOUNTER — Other Ambulatory Visit (HOSPITAL_COMMUNITY)
Admission: RE | Admit: 2024-08-23 | Discharge: 2024-08-23 | Disposition: A | Discharge status: Home / Self Care | Source: Ambulatory Visit | Attending: Obstetrics and Gynecology | Admitting: Obstetrics and Gynecology

## 2024-08-23 ENCOUNTER — Ambulatory Visit (INDEPENDENT_AMBULATORY_CARE_PROVIDER_SITE_OTHER): Admitting: Obstetrics and Gynecology

## 2024-08-23 VITALS — BP 117/79 | HR 75 | Wt 213.0 lb

## 2024-08-23 DIAGNOSIS — Z3403 Encounter for supervision of normal first pregnancy, third trimester: Secondary | ICD-10-CM | POA: Insufficient documentation

## 2024-08-23 DIAGNOSIS — Z3A36 36 weeks gestation of pregnancy: Secondary | ICD-10-CM

## 2024-08-23 NOTE — Progress Notes (Signed)
 ROB. Pt request cervix check.   Pt has paperwork from employer that requires provider signature.

## 2024-08-23 NOTE — Progress Notes (Signed)
   PRENATAL VISIT NOTE  Subjective:  Valerie Hughes is a 23 y.o. G2P0010 at [redacted]w[redacted]d being seen today for ongoing prenatal care.  She is currently monitored for the following issues for this low-risk pregnancy and has Encounter for supervision of normal first pregnancy in second trimester; ADD (attention deficit disorder); and Adnexal cyst on their problem list.  Patient reports no complaints.  Contractions: Irritability. Vag. Bleeding: None.  Movement: Increased. Denies leaking of fluid.   The following portions of the patient's history were reviewed and updated as appropriate: allergies, current medications, past family history, past medical history, past social history, past surgical history and problem list.   Objective:    Vitals:   08/23/24 1451  BP: 117/79  Pulse: 75  Weight: 213 lb (96.6 kg)    Fetal Status:  Fetal Heart Rate (bpm): 144 Fundal Height: 36 cm Movement: Increased Presentation: Vertex  General: Alert, oriented and cooperative. Patient is in no acute distress.  Skin: Skin is warm and dry. No rash noted.   Cardiovascular: Normal heart rate noted  Respiratory: Normal respiratory effort, no problems with respiration noted  Abdomen: Soft, gravid, appropriate for gestational age.  Pain/Pressure: Present     Pelvic: Cervical exam performed in the presence of a chaperone Dilation: 1 Effacement (%): Thick Station: -3  Extremities: Normal range of motion.  Edema: Trace  Mental Status: Normal mood and affect. Normal behavior. Normal judgment and thought content.   Assessment and Plan:  Pregnancy: G2P0010 at [redacted]w[redacted]d 1. Encounter for supervision of normal first pregnancy in third trimester (Primary) BP and FHR normal Doing well, feeling regular movement    2. [redacted] weeks gestation of pregnancy Swabs collected today  Labor precautions - Cervicovaginal ancillary only( [place]) - Culture, beta strep (group b only)   Term labor symptoms and general obstetric precautions  including but not limited to vaginal bleeding, contractions, leaking of fluid and fetal movement were reviewed in detail with the patient. Please refer to After Visit Summary for other counseling recommendations.   Return in about 1 week (around 08/30/2024) for OB VISIT (MD or APP).    Nidia Daring, FNP

## 2024-08-25 ENCOUNTER — Encounter: Payer: Self-pay | Admitting: Obstetrics and Gynecology

## 2024-08-25 ENCOUNTER — Other Ambulatory Visit: Payer: Self-pay

## 2024-08-25 LAB — CERVICOVAGINAL ANCILLARY ONLY
Bacterial Vaginitis (gardnerella): NEGATIVE
Candida Glabrata: NEGATIVE
Candida Vaginitis: NEGATIVE
Chlamydia: NEGATIVE
Comment: NEGATIVE
Comment: NEGATIVE
Comment: NEGATIVE
Comment: NEGATIVE
Comment: NEGATIVE
Comment: NORMAL
Neisseria Gonorrhea: NEGATIVE
Trichomonas: NEGATIVE

## 2024-08-25 MED ORDER — FLUTICASONE PROPIONATE 50 MCG/ACT NA SUSP: 1.0000 | Freq: Every day | NASAL | 0 refills | Status: DC

## 2024-08-25 MED ORDER — FLUTICASONE PROPIONATE 50 MCG/ACT NA SUSP: 1.0000 | Freq: Every day | NASAL | 0 refills | Status: AC

## 2024-08-27 LAB — CULTURE, BETA STREP (GROUP B ONLY): Strep Gp B Culture: NEGATIVE

## 2024-09-03 ENCOUNTER — Ambulatory Visit (INDEPENDENT_AMBULATORY_CARE_PROVIDER_SITE_OTHER): Admitting: Physician Assistant

## 2024-09-03 VITALS — BP 116/80 | HR 98 | Wt 215.0 lb

## 2024-09-03 DIAGNOSIS — Z3403 Encounter for supervision of normal first pregnancy, third trimester: Secondary | ICD-10-CM

## 2024-09-03 DIAGNOSIS — O368130 Decreased fetal movements, third trimester, not applicable or unspecified: Secondary | ICD-10-CM | POA: Diagnosis not present

## 2024-09-03 DIAGNOSIS — Z3A38 38 weeks gestation of pregnancy: Secondary | ICD-10-CM | POA: Diagnosis not present

## 2024-09-03 NOTE — Progress Notes (Signed)
 Pt presents for rob. Pt complains of decreased movement. No other questions or concerns at this time.

## 2024-09-03 NOTE — Progress Notes (Signed)
   PRENATAL VISIT NOTE  Subjective:  Valerie Hughes is a 23 y.o. G2P0010 at [redacted]w[redacted]d being seen today for ongoing prenatal care.  She is currently monitored for the following issues for this low-risk pregnancy and has Encounter for supervision of normal first pregnancy in second trimester; ADD (attention deficit disorder); and Adnexal cyst on their problem list.  Patient reports decreased fetal movement during days.   Contractions: Irritability. Vag. Bleeding: None.  Movement: (!) Decreased. Denies leaking of fluid.   The following portions of the patient's history were reviewed and updated as appropriate: allergies, current medications, past family history, past medical history, past social history, past surgical history and problem list.   Objective:    Vitals:   09/03/24 1149  BP: 116/80  Pulse: 98  Weight: 215 lb (97.5 kg)    Fetal Status:  Fetal Heart Rate (bpm): 142 Fundal Height: 37 cm Movement: (!) Decreased    General: Alert, oriented and cooperative. Patient is in no acute distress.  Skin: Skin is warm and dry. No rash noted.   Cardiovascular: Normal heart rate noted  Respiratory: Normal respiratory effort, no problems with respiration noted  Abdomen: Soft, gravid, appropriate for gestational age.  Pain/Pressure: Present     Pelvic: Cervical exam deferred        Extremities: Normal range of motion.  Edema: None  Mental Status: Normal mood and affect. Normal behavior. Normal judgment and thought content.   Assessment and Plan:  Pregnancy: G2P0010 at [redacted]w[redacted]d  1. Encounter for supervision of normal first pregnancy in third trimester (Primary) Patient doing well BP, FHR, FH appropriate  2. [redacted] weeks gestation of pregnancy Anticipatory guidance about next visits/weeks of pregnancy given.  Postdates IOL scheduled 09/17/24  3. Decreased fetal movements in third trimester, single or unspecified fetus Reactive, category 1 NST    Term labor symptoms and general obstetric  precautions including but not limited to vaginal bleeding, contractions, leaking of fluid and fetal movement were reviewed in detail with the patient.  Please refer to After Visit Summary for other counseling recommendations.   Return in about 1 week (around 09/10/2024) for LOB.  Future Appointments  Date Time Provider Department Center  09/09/2024  8:55 AM Abigail, Rollo DASEN, MD [facility] None  09/18/2024 12:00 AM [facility] SCHED ROOM [facility] None    Jorene FORBES Moats, PA-C

## 2024-09-07 ENCOUNTER — Encounter: Payer: Self-pay | Admitting: Obstetrics and Gynecology

## 2024-09-09 ENCOUNTER — Ambulatory Visit (INDEPENDENT_AMBULATORY_CARE_PROVIDER_SITE_OTHER): Admitting: Obstetrics and Gynecology

## 2024-09-09 VITALS — BP 120/83 | HR 88 | Wt 218.0 lb

## 2024-09-09 DIAGNOSIS — Z3403 Encounter for supervision of normal first pregnancy, third trimester: Secondary | ICD-10-CM

## 2024-09-09 NOTE — Progress Notes (Signed)
 Pt would like membrane sweep today.

## 2024-09-09 NOTE — Progress Notes (Signed)
   PRENATAL VISIT NOTE  Subjective:  Valerie Hughes is a 23 y.o. G2P0010 at [redacted]w[redacted]d being seen today for ongoing prenatal care.  She is currently monitored for the following issues for this low-risk pregnancy and has Encounter for supervision of normal first pregnancy in second trimester; ADD (attention deficit disorder); and Adnexal cyst on their problem list.  Patient reports no complaints.  Contractions: Irritability. Vag. Bleeding: None.  Movement: Present. Denies leaking of fluid.   The following portions of the patient's history were reviewed and updated as appropriate: allergies, current medications, past family history, past medical history, past social history, past surgical history and problem list.   Objective:   Vitals:   09/09/24 0911  BP: 120/83  Pulse: 88  Weight: 218 lb (98.9 kg)   Body mass index is 38.62 kg/m. Total weight gain: 57 lb 3.2 oz (25.9 kg)   Fetal Status: Fetal Heart Rate (bpm): 150 Fundal Height: 39 cm Movement: Present  Presentation: Vertex  General:  Alert, oriented and cooperative. Patient is in no acute distress.  Skin: Skin is warm and dry. No rash noted.   Cardiovascular: Normal heart rate noted  Respiratory: Normal respiratory effort, no problems with respiration noted  Abdomen: Soft, gravid, appropriate for gestational age.  Pain/Pressure: Present     Pelvic: Cervical exam performed in the presence of a chaperone Dilation: 1 Effacement (%): 70 Station: -3 membrane sweep per pt request  Extremities: Normal range of motion.     Mental Status: Normal mood and affect. Normal behavior. Normal judgment and thought content.   Assessment and Plan:  Pregnancy: G2P0010 at [redacted]w[redacted]d 1. Encounter for supervision of normal first pregnancy in third trimester (Primary) Anticipatory guidance Patient requested membrane sweep, consented for procedure Labor precautions Postdates IOL scheduled  Term labor symptoms and general obstetric precautions including but not  limited to vaginal bleeding, contractions, leaking of fluid and fetal movement were reviewed in detail with the patient. Please refer to After Visit Summary for other counseling recommendations.   Return in about 1 week (around 09/16/2024).  Future Appointments  Date Time Provider Department Center  09/18/2024 12:00 AM [facility] SCHED ROOM [facility] None    Rollo ONEIDA Bring, MD

## 2024-09-10 ENCOUNTER — Encounter (HOSPITAL_COMMUNITY): Payer: Self-pay | Admitting: Obstetrics & Gynecology

## 2024-09-10 ENCOUNTER — Inpatient Hospital Stay (HOSPITAL_COMMUNITY): Admitting: Anesthesiology

## 2024-09-10 ENCOUNTER — Inpatient Hospital Stay (HOSPITAL_COMMUNITY)
Admission: AD | Admit: 2024-09-10 | Discharge: 2024-09-13 | DRG: 768 | Disposition: A | Attending: Obstetrics & Gynecology | Admitting: Obstetrics & Gynecology

## 2024-09-10 ENCOUNTER — Other Ambulatory Visit: Payer: Self-pay

## 2024-09-10 DIAGNOSIS — D62 Acute posthemorrhagic anemia: Secondary | ICD-10-CM | POA: Diagnosis not present

## 2024-09-10 DIAGNOSIS — Z833 Family history of diabetes mellitus: Secondary | ICD-10-CM

## 2024-09-10 DIAGNOSIS — O4292 Full-term premature rupture of membranes, unspecified as to length of time between rupture and onset of labor: Principal | ICD-10-CM | POA: Diagnosis present

## 2024-09-10 DIAGNOSIS — O9081 Anemia of the puerperium: Secondary | ICD-10-CM | POA: Diagnosis not present

## 2024-09-10 DIAGNOSIS — O411230 Chorioamnionitis, third trimester, not applicable or unspecified: Secondary | ICD-10-CM | POA: Diagnosis present

## 2024-09-10 DIAGNOSIS — Z7982 Long term (current) use of aspirin: Secondary | ICD-10-CM | POA: Diagnosis not present

## 2024-09-10 DIAGNOSIS — Z3A39 39 weeks gestation of pregnancy: Secondary | ICD-10-CM | POA: Diagnosis not present

## 2024-09-10 DIAGNOSIS — O4290 Premature rupture of membranes, unspecified as to length of time between rupture and onset of labor, unspecified weeks of gestation: Principal | ICD-10-CM | POA: Diagnosis present

## 2024-09-10 LAB — CBC
HCT: 41.4 % (ref 36.0–46.0)
HCT: 39.4 % (ref 36.0–46.0)
Hemoglobin: 14.4 g/dL (ref 12.0–15.0)
Hemoglobin: 13.7 g/dL (ref 12.0–15.0)
MCH: 32.4 pg (ref 26.0–34.0)
MCH: 32.2 pg (ref 26.0–34.0)
MCHC: 34.8 g/dL (ref 30.0–36.0)
MCHC: 34.8 g/dL (ref 30.0–36.0)
MCV: 93 fL (ref 80.0–100.0)
MCV: 92.5 fL (ref 80.0–100.0)
Platelets: 172 K/uL (ref 150–400)
Platelets: 167 K/uL (ref 150–400)
RBC: 4.45 MIL/uL (ref 3.87–5.11)
RBC: 4.26 MIL/uL (ref 3.87–5.11)
RDW: 13.2 % (ref 11.5–15.5)
RDW: 13.3 % (ref 11.5–15.5)
WBC: 13.2 K/uL — ABNORMAL HIGH (ref 4.0–10.5)
WBC: 14.4 K/uL — ABNORMAL HIGH (ref 4.0–10.5)
nRBC: 0 % (ref 0.0–0.2)
nRBC: 0 % (ref 0.0–0.2)

## 2024-09-10 LAB — COMPREHENSIVE METABOLIC PANEL WITH GFR
ALT: 18 U/L (ref 0–44)
AST: 28 U/L (ref 15–41)
Albumin: 2.8 g/dL — ABNORMAL LOW (ref 3.5–5.0)
Alkaline Phosphatase: 146 U/L — ABNORMAL HIGH (ref 38–126)
Anion gap: 9 (ref 5–15)
BUN: <5 mg/dL — ABNORMAL LOW (ref 6–20)
CO2: 24 mmol/L (ref 22–32)
Calcium: 9.2 mg/dL (ref 8.9–10.3)
Chloride: 102 mmol/L (ref 98–111)
Creatinine, Ser: 0.55 mg/dL (ref 0.44–1.00)
GFR, Estimated: >60 mL/min (ref >60)
Glucose, Bld: 91 mg/dL (ref 70–99)
Potassium: 4.5 mmol/L (ref 3.5–5.1)
Sodium: 135 mmol/L (ref 135–145)
Total Bilirubin: 0.7 mg/dL (ref 0.0–1.2)
Total Protein: 6.2 g/dL — ABNORMAL LOW (ref 6.5–8.1)

## 2024-09-10 LAB — PROTEIN / CREATININE RATIO, URINE
Creatinine, Urine: 68 mg/dL
Protein Creatinine Ratio: 0.28 mg/mg{creat} — ABNORMAL HIGH (ref 0.00–0.15)
Total Protein, Urine: 19 mg/dL

## 2024-09-10 LAB — POCT FERN TEST: POCT Fern Test: POSITIVE

## 2024-09-10 LAB — TYPE AND SCREEN
ABO/RH(D): O POS
Antibody Screen: NEGATIVE

## 2024-09-10 LAB — RPR: RPR Ser Ql: NONREACTIVE

## 2024-09-10 MED ORDER — ACETAMINOPHEN 325 MG PO TABS: 650.0000 mg | ORAL_TABLET | ORAL | Status: DC | PRN

## 2024-09-10 MED ORDER — SODIUM CHLORIDE 0.9 % IV SOLN: 250.0000 mL | INTRAVENOUS | Status: AC | PRN

## 2024-09-10 MED ORDER — LACTATED RINGERS IV SOLN
500.0000 mL | Freq: Once | INTRAVENOUS | Status: DC
Start: 2024-09-10 — End: 2024-09-11

## 2024-09-10 MED ORDER — TERBUTALINE SULFATE 1 MG/ML IJ SOLN: 0.2500 mg | Freq: Once | INTRAMUSCULAR | Status: DC | PRN

## 2024-09-10 MED ORDER — LIDOCAINE HCL (PF) 1 % IJ SOLN: 30.0000 mL | INTRAMUSCULAR | Status: DC | PRN

## 2024-09-10 MED ORDER — OXYCODONE-ACETAMINOPHEN 5-325 MG PO TABS: 1.0000 | ORAL_TABLET | ORAL | Status: DC | PRN

## 2024-09-10 MED ORDER — EPHEDRINE 5 MG/ML INJ: 10.0000 mg | INTRAVENOUS | Status: DC | PRN

## 2024-09-10 MED ORDER — PHENYLEPHRINE 80 MCG/ML (10ML) SYRINGE FOR IV PUSH (FOR BLOOD PRESSURE SUPPORT)
80.0000 ug | PREFILLED_SYRINGE | INTRAVENOUS | Status: DC | PRN
  Filled 2024-09-10: qty 10

## 2024-09-10 MED ORDER — FENTANYL-BUPIVACAINE-NACL 0.5-0.125-0.9 MG/250ML-% EP SOLN
12.0000 mL/h | EPIDURAL | Status: DC | PRN
  Administered 2024-09-10: 12 mL/h via EPIDURAL
  Filled 2024-09-10: qty 250

## 2024-09-10 MED ORDER — OXYTOCIN-SODIUM CHLORIDE 30-0.9 UT/500ML-% IV SOLN: 1.0000 m[IU]/min | INTRAVENOUS | Status: DC

## 2024-09-10 MED ORDER — SODIUM CHLORIDE 0.9% FLUSH: 3.0000 mL | INTRAVENOUS | Status: DC | PRN

## 2024-09-10 MED ORDER — SOD CITRATE-CITRIC ACID 500-334 MG/5ML PO SOLN: 30.0000 mL | ORAL | Status: DC | PRN

## 2024-09-10 MED ORDER — FENTANYL CITRATE (PF) 100 MCG/2ML IJ SOLN
50.0000 ug | INTRAMUSCULAR | Status: DC | PRN
  Administered 2024-09-10: 100 ug via INTRAVENOUS
  Administered 2024-09-10: 50 ug via INTRAVENOUS
  Filled 2024-09-10 (×2): qty 2

## 2024-09-10 MED ORDER — HYDROXYZINE HCL 50 MG PO TABS: 50.0000 mg | ORAL_TABLET | Freq: Four times a day (QID) | ORAL | Status: DC | PRN

## 2024-09-10 MED ORDER — PHENYLEPHRINE 80 MCG/ML (10ML) SYRINGE FOR IV PUSH (FOR BLOOD PRESSURE SUPPORT): 80.0000 ug | PREFILLED_SYRINGE | INTRAVENOUS | Status: DC | PRN

## 2024-09-10 MED ORDER — ONDANSETRON HCL 4 MG/2ML IJ SOLN
4.0000 mg | Freq: Four times a day (QID) | INTRAMUSCULAR | Status: DC | PRN
  Administered 2024-09-10 – 2024-09-11 (×3): 4 mg via INTRAVENOUS
  Filled 2024-09-10 (×3): qty 2

## 2024-09-10 MED ORDER — LIDOCAINE HCL (PF) 1 % IJ SOLN
INTRAMUSCULAR | Status: DC | PRN
  Administered 2024-09-10 (×2): 4 mL via EPIDURAL

## 2024-09-10 MED ORDER — LACTATED RINGERS IV SOLN: INTRAVENOUS | Status: DC

## 2024-09-10 MED ORDER — OXYTOCIN-SODIUM CHLORIDE 30-0.9 UT/500ML-% IV SOLN
1.0000 m[IU]/min | INTRAVENOUS | Status: DC
  Administered 2024-09-10: 2 m[IU]/min via INTRAVENOUS
  Filled 2024-09-10: qty 500

## 2024-09-10 MED ORDER — OXYTOCIN BOLUS FROM INFUSION
333.0000 mL | Freq: Once | INTRAVENOUS | Status: AC
  Administered 2024-09-11: 333 mL via INTRAVENOUS

## 2024-09-10 MED ORDER — DIPHENHYDRAMINE HCL 50 MG/ML IJ SOLN: 12.5000 mg | INTRAMUSCULAR | Status: DC | PRN

## 2024-09-10 MED ORDER — SODIUM CHLORIDE 0.9% FLUSH: 3.0000 mL | Freq: Two times a day (BID) | INTRAVENOUS | Status: DC

## 2024-09-10 MED ORDER — OXYCODONE-ACETAMINOPHEN 5-325 MG PO TABS: 2.0000 | ORAL_TABLET | ORAL | Status: DC | PRN

## 2024-09-10 MED ORDER — OXYTOCIN-SODIUM CHLORIDE 30-0.9 UT/500ML-% IV SOLN
2.5000 [IU]/h | INTRAVENOUS | Status: DC
  Administered 2024-09-11: 2.5 [IU]/h via INTRAVENOUS
  Filled 2024-09-10: qty 500

## 2024-09-10 MED ORDER — LACTATED RINGERS IV SOLN
500.0000 mL | INTRAVENOUS | Status: DC | PRN
  Administered 2024-09-10: 500 mL via INTRAVENOUS
  Administered 2024-09-10: 1000 mL via INTRAVENOUS

## 2024-09-10 NOTE — Anesthesia Procedure Notes (Signed)
 Epidural Patient location during procedure: OB Start time: 09/10/2024 2:27 PM End time: 09/10/2024 2:30 PM  Staffing Anesthesiologist: Paul Lamarr BRAVO, MD Performed: anesthesiologist   Preanesthetic Checklist Completed: patient identified, IV checked, risks and benefits discussed, monitors and equipment checked, pre-op evaluation and timeout performed  Epidural Patient position: sitting Prep: DuraPrep and site prepped and draped Patient monitoring: continuous pulse ox, blood pressure and heart rate Approach: midline Location: L3-L4 Injection technique: LOR air  Needle:  Needle type: Tuohy  Needle gauge: 17 G Needle length: 9 cm Needle insertion depth: 6 cm Catheter type: closed end flexible Catheter size: 19 Gauge Catheter at skin depth: 11 cm Test dose: negative and Other (1% lidocaine )  Assessment Events: blood not aspirated, no cerebrospinal fluid, injection not painful, no injection resistance, no paresthesia and negative IV test  Additional Notes Patient identified. Risks, benefits, and alternatives discussed with patient including but not limited to bleeding, infection, nerve damage, paralysis, failed block, incomplete pain control, headache, blood pressure changes, nausea, vomiting, reactions to medication, itching, and postpartum back pain. Confirmed with bedside nurse the patient's most recent platelet count. Confirmed with patient that they are not currently taking any anticoagulation, have any bleeding history, or any family history of bleeding disorders. Patient expressed understanding and wished to proceed. All questions were answered. Sterile technique was used throughout the entire procedure. Please see nursing notes for vital signs.   Crisp LOR on first pass. Test dose was given through epidural catheter and negative prior to continuing to dose epidural or start infusion. Warning signs of high block given to the patient including shortness of breath,  tingling/numbness in hands, complete motor block, or any concerning symptoms with instructions to call for help. Patient was given instructions on fall risk and not to get out of bed. All questions and concerns addressed with instructions to call with any issues or inadequate analgesia.  Reason for block:procedure for pain

## 2024-09-10 NOTE — Anesthesia Preprocedure Evaluation (Signed)
 Anesthesia Evaluation  Patient identified by MRN, date of birth, ID band Patient awake    Reviewed: Allergy & Precautions, Patient's Chart, lab work & pertinent test results  History of Anesthesia Complications Negative for: history of anesthetic complications  Airway Mallampati: II  TM Distance: >3 FB Neck ROM: Full    Dental no notable dental hx.    Pulmonary neg pulmonary ROS   Pulmonary exam normal        Cardiovascular negative cardio ROS Normal cardiovascular exam     Neuro/Psych negative neurological ROS     GI/Hepatic negative GI ROS, Neg liver ROS,,,  Endo/Other  negative endocrine ROS    Renal/GU negative Renal ROS     Musculoskeletal negative musculoskeletal ROS (+)    Abdominal   Peds  Hematology negative hematology ROS (+)   Anesthesia Other Findings   Reproductive/Obstetrics (+) Pregnancy                              Anesthesia Physical Anesthesia Plan  ASA: 2  Anesthesia Plan: Epidural   Post-op Pain Management:    Induction:   PONV Risk Score and Plan: Treatment may vary due to age or medical condition  Airway Management Planned: Natural Airway  Additional Equipment: Fetal Monitoring  Intra-op Plan:   Post-operative Plan:   Informed Consent: I have reviewed the patients History and Physical, chart, labs and discussed the procedure including the risks, benefits and alternatives for the proposed anesthesia with the patient or authorized representative who has indicated his/her understanding and acceptance.       Plan Discussed with:   Anesthesia Plan Comments:          Anesthesia Quick Evaluation

## 2024-09-10 NOTE — H&P (Addendum)
 Valerie Hughes is a 23 y.o. G2P0010 female at 107w6d by US  presenting for PROM.   Reports active fetal movement, contractions: none, vaginal bleeding: none, membranes: ruptured, clear fluid.  Initiated prenatal care at [facility] at 11.2 wks.   Most recent u/s 29w 1d.   This pregnancy complicated by: Placenta previa, resolved ADD Adnexal cyst   Past Medical History: Past Medical History:  Diagnosis Date   ADHD     Past Surgical History: Past Surgical History:  Procedure Laterality Date   RECONSTRUCTION OF NOSE     TONSILLECTOMY AND ADENOIDECTOMY      Obstetrical History: OB History     Gravida  2   Para  0   Term  0   Preterm  0   AB  1   Living  0      SAB  0   IAB  1   Ectopic  0   Multiple  0   Live Births  0           Social History: Social History   Socioeconomic History   Marital status: Single    Spouse name: Not on file   Number of children: Not on file   Years of education: Not on file   Highest education level: Not on file  Occupational History   Not on file  Tobacco Use   Smoking status: Never   Smokeless tobacco: Never  Vaping Use   Vaping status: Never Used  Substance and Sexual Activity   Alcohol use: Never   Drug use: Never   Sexual activity: Yes    Birth control/protection: None  Other Topics Concern   Not on file  Social History Narrative   Not on file   Social Drivers of Health   Financial Resource Strain: Not on file  Food Insecurity: No Food Insecurity (09/10/2024)   Hunger Vital Sign    Worried About Running Out of Food in the Last Year: Never true    Ran Out of Food in the Last Year: Never true  Transportation Needs: No Transportation Needs (09/10/2024)   PRAPARE - Administrator, Civil Service (Medical): No    Lack of Transportation (Non-Medical): No  Physical Activity: Not on file  Stress: Not on file  Social Connections: Unknown (03/07/2023)   Received from [facility]   Social Network     Social Network: Not on file    Family History: Family History  Problem Relation Age of Onset   Healthy Mother    Healthy Father    Diabetes Maternal Grandmother    Alzheimer's disease Paternal Grandfather    Cancer Maternal Aunt 50 - 59       breast cancer    Allergies: Allergies  Allergen Reactions   Amoxicillin Hives    Medications Prior to Admission  Medication Sig Dispense Refill Last Dose/Taking   Blood Pressure Monitoring (BLOOD PRESSURE KIT) DEVI 1 Device by Does not apply route once a week. 1 each 0 09/09/2024 Morning   cetirizine (ZYRTEC) 10 MG tablet Take 1 tablet by mouth daily.   09/09/2024   fluticasone  (FLONASE ) 50 MCG/ACT nasal spray Place 1 spray into both nostrils daily. 9.9 mL 0 09/09/2024   prenatal vitamin w/FE, FA (PRENATAL 1 + 1) 27-1 MG TABS tablet Take 1 tablet by mouth daily at 12 noon. 100 tablet 06 09/09/2024   aspirin  EC 81 MG tablet Take 1 tablet (81 mg total) by mouth daily. Swallow whole. 30 tablet 11  Review of Systems  Pertinent pos/neg as indicated in HPI  Blood pressure 133/88, pulse 78, temperature 98.3 F (36.8 C), temperature source Oral, resp. rate 16, height 5' 4 (1.626 m), weight 99.5 kg, last menstrual period 11/28/2023, SpO2 100%. General appearance: alert, cooperative, and appears stated age Lungs: clear to auscultation bilaterally Heart: regular rate and rhythm Abdomen: gravid, soft, non-tender, EFW by Leopold's approximately 7lbs Extremities: non pitting edema DTR's +2  Fetal monitoring: FHR: 150 bpm, variability: moderate,  Accelerations: Present,  decelerations:  Absent Uterine activity: Frequency: Every 2-3 minutes Dilation: 1 Effacement (%): 70 Station: -3 Exam by:: Oleh Loges RN Presentation: cephalic   Prenatal labs: ABO, Rh: O/Positive/-- (03/10 1154) Antibody: Negative (03/10 1154) Rubella: 3.74 (03/10 1154) RPR: Non Reactive (07/02 0813)  HBsAg: Negative (03/10 1154)  HIV: Non Reactive (07/02 0813)   GBS: Negative/-- (09/08 1547)  2hr GTT: 103  Prenatal Transfer Tool  Maternal Diabetes: No Genetic Screening: Normal Maternal Ultrasounds/Referrals: Normal Fetal Ultrasounds or other Referrals:  None Maternal Substance Abuse:  No Significant Maternal Medications:  None Significant Maternal Lab Results: Group B Strep negative  Results for orders placed or performed during the hospital encounter of 09/10/24 (from the past 24 hours)  Fern Test   Collection Time: 09/10/24  4:29 AM  Result Value Ref Range   POCT Fern Test Positive = ruptured amniotic membanes      Assessment:  [redacted]w[redacted]d SIUP  G2P0010  Cat 1 FHR  GBS Negative/-- (09/08 1547)  Plan:  Admit to L&D  Options discussed w/pt:  expectant mgt for now, open to a balloon if not progressing  IV pain meds/epidural prn active labor  Anticipate NSVD   Plans to breastfeed  Contraception: POP; postpartum visit  Circumcision: N/A  Rolin SAILOR Kight CNM 09/10/2024, 4:56 AM   I personally saw and evaluated the patient, performing the key elements of the service. I developed and verified the management plan that is described in the resident's/student's note, and I agree with the content with my edits above. VSS, HRR&R, Resp unlabored, Legs neg.  Sherrell Ely, CNM 09/10/2024 8:02 AM

## 2024-09-10 NOTE — Progress Notes (Signed)
 Valerie Hughes is a 23 y.o. G2P0010 at [redacted]w[redacted]d by 6 week ultrasound admitted for PROM  Subjective: Pt comfortable with epidural.  Family in room for support.    Objective: BP 118/61   Pulse 96   Temp 98.7 F (37.1 C) (Oral)   Resp 16   Ht 5' 4 (1.626 m)   Wt 99.5 kg   LMP 11/28/2023   SpO2 99%   BMI 37.64 kg/m  I/O last 3 completed shifts: In: -  Out: 250 [Urine:250] Total I/O In: -  Out: 750 [Urine:750]  FHT:  FHR: 135 bpm, variability: moderate,  accelerations:  Present,  decelerations:  Absent UC:   regular, every 2-3 minutes SVE:   Dilation: 8 Effacement (%): 80 Station: 0, -1 Exam by:: Rachel L SNM  Labs: Lab Results  Component Value Date   WBC 14.4 (H) 09/10/2024   HGB 13.7 09/10/2024   HCT 39.4 09/10/2024   MCV 92.5 09/10/2024   PLT 167 09/10/2024    Assessment / Plan: Augmentation of labor, progressing well  Labor: Progressing normally Preeclampsia:  n/a Fetal Wellbeing:  Category I Pain Control:  Epidural I/D:  GBS neg Anticipated MOD:  NSVD  Olam Boards, CNM 09/10/2024, 10:59 PM

## 2024-09-10 NOTE — Progress Notes (Signed)
 Labor Progress Note Valerie Hughes is a 23 y.o. G2P0010 at [redacted]w[redacted]d presenting for SROM/early labor  S: Pt is feeling 7 out of 10 pain with ctx. Discussed FB and movement. Pt desires to avoid pitocin  if possible.  O:  BP 134/86   Pulse 83   Temp 98.5 F (36.9 C) (Oral)   Resp 16   Ht 5' 4 (1.626 m)   Wt 99.5 kg   LMP 11/28/2023   SpO2 100%   BMI 37.64 kg/m  Lab Results  Component Value Date   HGB 14.4 09/10/2024    Time: 9:06 AM  FHT: baseline bpm 140 baseline change to 150 bpm, moderate variability, accelerations present, decelerations none,   Contractions: q 5 mins,    CVE: Dilation: 2.5 Effacement (%): 80 Station: -3 Presentation: Vertex Exam by:: Dr. Trudy FB placed with 50ml, hopefully will not stay in long, cervix very stretchy   A&P: 23 y.o. G2P0010 [redacted]w[redacted]d SROM/early labor #Labor: Latent Labor IP Foley  #Pain: Labor support without medications and IV pain meds #FWB: Category I #GBS negative   Leeroy KATHEE Trudy, MD 9:06 AM

## 2024-09-10 NOTE — MAU Note (Signed)
   MAU Labor Triage Note:  .Valerie Hughes is a 23 y.o. at [redacted]w[redacted]d here in MAU reporting:  Contractions every: 2-3 minutes Onset of ctx: 0000 this AM Pain Score: 7  Pain Location: Abdomen  ROM: has felt thin, watery discharge since getting up at midnight Vaginal Bleeding: denies Last SVE: 1/70/-3 yesterday  GBS: Negative  Fetal Movement: Reports positive FM FHT: Fetal Heart Rate Mode: External Baseline Rate (A): 148 bpm Multiple birth?: No  Vitals:   09/10/24 0350  BP: 133/88  Pulse: 78  Resp: 16  Temp: 98.3 F (36.8 C)  SpO2: 100%      Lab orders placed from triage: MAU Labor Eval OB Office: Faculty

## 2024-09-11 DIAGNOSIS — O480 Post-term pregnancy: Secondary | ICD-10-CM

## 2024-09-11 DIAGNOSIS — Z3A40 40 weeks gestation of pregnancy: Secondary | ICD-10-CM

## 2024-09-11 DIAGNOSIS — O411230 Chorioamnionitis, third trimester, not applicable or unspecified: Secondary | ICD-10-CM

## 2024-09-11 DIAGNOSIS — O4202 Full-term premature rupture of membranes, onset of labor within 24 hours of rupture: Secondary | ICD-10-CM

## 2024-09-11 LAB — CBC
HCT: 34.2 % — ABNORMAL LOW (ref 36.0–46.0)
Hemoglobin: 11.8 g/dL — ABNORMAL LOW (ref 12.0–15.0)
MCH: 32.3 pg (ref 26.0–34.0)
MCHC: 34.5 g/dL (ref 30.0–36.0)
MCV: 93.7 fL (ref 80.0–100.0)
Platelets: 135 K/uL — ABNORMAL LOW (ref 150–400)
RBC: 3.65 MIL/uL — ABNORMAL LOW (ref 3.87–5.11)
RDW: 13.3 % (ref 11.5–15.5)
WBC: 22.5 K/uL — ABNORMAL HIGH (ref 4.0–10.5)
nRBC: 0 % (ref 0.0–0.2)

## 2024-09-11 MED ORDER — TRANEXAMIC ACID-NACL 1000-0.7 MG/100ML-% IV SOLN: 1000.0000 mg | INTRAVENOUS | Status: AC

## 2024-09-11 MED ORDER — DIPHENHYDRAMINE HCL 25 MG PO CAPS: 25.0000 mg | ORAL_CAPSULE | Freq: Four times a day (QID) | ORAL | Status: DC | PRN

## 2024-09-11 MED ORDER — ONDANSETRON HCL 4 MG PO TABS: 4.0000 mg | ORAL_TABLET | ORAL | Status: DC | PRN

## 2024-09-11 MED ORDER — IBUPROFEN 600 MG PO TABS
600.0000 mg | ORAL_TABLET | Freq: Four times a day (QID) | ORAL | Status: DC
  Administered 2024-09-11 – 2024-09-13 (×9): 600 mg via ORAL
  Filled 2024-09-11 (×9): qty 1

## 2024-09-11 MED ORDER — PRENATAL MULTIVITAMIN CH
1.0000 | ORAL_TABLET | Freq: Every day | ORAL | Status: DC
Start: 2024-09-11 — End: 2024-09-13
  Administered 2024-09-11 – 2024-09-13 (×3): 1 via ORAL
  Filled 2024-09-11 (×3): qty 1

## 2024-09-11 MED ORDER — ZOLPIDEM TARTRATE 5 MG PO TABS: 5.0000 mg | ORAL_TABLET | Freq: Every evening | ORAL | Status: DC | PRN

## 2024-09-11 MED ORDER — GENTAMICIN SULFATE 40 MG/ML IJ SOLN
5.0000 mg/kg | Freq: Once | INTRAVENOUS | Status: AC
  Administered 2024-09-11: 360 mg via INTRAVENOUS
  Filled 2024-09-11: qty 9

## 2024-09-11 MED ORDER — COCONUT OIL OIL: 1.0000 | TOPICAL_OIL | Status: DC | PRN

## 2024-09-11 MED ORDER — WITCH HAZEL-GLYCERIN EX PADS: 1.0000 | MEDICATED_PAD | CUTANEOUS | Status: DC | PRN

## 2024-09-11 MED ORDER — CEFAZOLIN SODIUM-DEXTROSE 2-4 GM/100ML-% IV SOLN
2.0000 g | Freq: Once | INTRAVENOUS | Status: AC
  Administered 2024-09-11: 2 g via INTRAVENOUS
  Filled 2024-09-11: qty 100

## 2024-09-11 MED ORDER — BENZOCAINE-MENTHOL 20-0.5 % EX AERO: 1.0000 | INHALATION_SPRAY | CUTANEOUS | Status: DC | PRN

## 2024-09-11 MED ORDER — TRANEXAMIC ACID-NACL 1000-0.7 MG/100ML-% IV SOLN
INTRAVENOUS | Status: AC
  Administered 2024-09-11: 1000 mg via INTRAVENOUS
  Filled 2024-09-11: qty 100

## 2024-09-11 MED ORDER — SIMETHICONE 80 MG PO CHEW: 80.0000 mg | CHEWABLE_TABLET | ORAL | Status: DC | PRN

## 2024-09-11 MED ORDER — GENTAMICIN SULFATE 40 MG/ML IJ SOLN
5.0000 mg/kg | Freq: Once | INTRAVENOUS | Status: DC
  Filled 2024-09-11: qty 12.5

## 2024-09-11 MED ORDER — SENNOSIDES-DOCUSATE SODIUM 8.6-50 MG PO TABS
2.0000 | ORAL_TABLET | Freq: Every day | ORAL | Status: DC
Start: 2024-09-12 — End: 2024-09-13
  Administered 2024-09-12 – 2024-09-13 (×2): 2 via ORAL
  Filled 2024-09-11 (×2): qty 2

## 2024-09-11 MED ORDER — TETANUS-DIPHTH-ACELL PERTUSSIS 5-2.5-18.5 LF-MCG/0.5 IM SUSY: 0.5000 mL | PREFILLED_SYRINGE | Freq: Once | INTRAMUSCULAR | Status: DC

## 2024-09-11 MED ORDER — DIBUCAINE (PERIANAL) 1 % EX OINT: 1.0000 | TOPICAL_OINTMENT | CUTANEOUS | Status: DC | PRN

## 2024-09-11 MED ORDER — ACETAMINOPHEN 500 MG PO TABS
1000.0000 mg | ORAL_TABLET | Freq: Once | ORAL | Status: AC
  Administered 2024-09-11: 1000 mg via ORAL
  Filled 2024-09-11: qty 2

## 2024-09-11 MED ORDER — ACETAMINOPHEN 500 MG PO TABS: 1000.0000 mg | ORAL_TABLET | Freq: Four times a day (QID) | ORAL | Status: DC | PRN

## 2024-09-11 MED ORDER — ONDANSETRON HCL 4 MG/2ML IJ SOLN: 4.0000 mg | INTRAMUSCULAR | Status: DC | PRN

## 2024-09-11 NOTE — Discharge Summary (Shared)
 Postpartum Discharge Summary  Date of Service updated***     Patient Name: Valerie Hughes DOB: 2001-01-02 MRN: [id number]  Date of admission: 09/10/2024 Delivery date:09/11/2024 Delivering provider: HONORE MILLMAN E Date of discharge: 09/11/2024  Admitting diagnosis: Premature rupture of membranes [O42.90] Intrauterine pregnancy: [redacted]w[redacted]d     Secondary diagnosis:  Principal Problem:   Premature rupture of membranes Active Problems:   NSVD (normal spontaneous vaginal delivery)  Additional problems: *** None   Discharge diagnosis: Term Pregnancy Delivered                                              Post partum procedures:{Postpartum procedures:23558} Augmentation: Pitocin  and IP Foley Complications: cervical laceration  Hospital course: Onset of Labor With Vaginal Delivery      23 y.o. yo G2P0010 at [redacted]w[redacted]d was admitted in Latent Labor on 09/10/2024. Labor course was complicated by meconium stained fluid. Membrane Rupture Time/Date: 12:00 AM,09/10/2024  Delivery Method:Vaginal, Spontaneous Operative Delivery:N/A Episiotomy: None Lacerations:  Cervical Patient had a postpartum course complicated by cervical laceration, repaired by Dr Ozan.  She is ambulating, tolerating a regular diet, passing flatus, and urinating well. Patient is discharged home in stable condition on 09/11/24.  Newborn Data: Birth date:09/11/2024 Birth time:6:34 AM Gender:Female Living status:Living Apgars:8 ,9  Weight:3900 g  Magnesium Sulfate received: No BMZ received: No Rhophylac:No MMR:No T-DaP:declined Flu: Yes RSV Vaccine received: No Transfusion:{Transfusion received:30440034}  Immunizations received: Immunization History  Administered Date(s) Administered   PFIZER(Purple Top)SARS-COV-2 Vaccination 12/13/2020   Tdap 04/16/2020    Physical exam  Vitals:   09/11/24 0802 09/11/24 0813 09/11/24 0832 09/11/24 0847  BP: 116/69 119/72 130/76 121/74  Pulse: (!) 123 (!) 125 (!) 119 (!) 123   Resp:      Temp:      TempSrc:      SpO2:      Weight:      Height:       General: {Exam; general:21111117} Lochia: {Desc; appropriate/inappropriate:30686::appropriate} Uterine Fundus: {Desc; firm/soft:30687} Incision: {Exam; incision:21111123} DVT Evaluation: {Exam; dvt:2111122} Labs: Lab Results  Component Value Date   WBC 22.5 (H) 09/11/2024   HGB 11.8 (L) 09/11/2024   HCT 34.2 (L) 09/11/2024   MCV 93.7 09/11/2024   PLT 135 (L) 09/11/2024      Latest Ref Rng & Units 09/10/2024    9:40 AM  CMP  Glucose 70 - 99 mg/dL 91   BUN 6 - 20 mg/dL <5   Creatinine 9.55 - 1.00 mg/dL 9.44   Sodium 864 - 854 mmol/L 135   Potassium 3.5 - 5.1 mmol/L 4.5   Chloride 98 - 111 mmol/L 102   CO2 22 - 32 mmol/L 24   Calcium 8.9 - 10.3 mg/dL 9.2   Total Protein 6.5 - 8.1 g/dL 6.2   Total Bilirubin 0.0 - 1.2 mg/dL 0.7   Alkaline Phos 38 - 126 U/L 146   AST 15 - 41 U/L 28   ALT 0 - 44 U/L 18    Edinburgh Score:     No data to display         No data recorded  After visit meds:  Allergies as of 09/11/2024       Reactions   Amoxicillin Hives     Med Rec must be completed prior to using this [facility]***        Discharge home in  stable condition Infant Feeding: {Baby feeding:23562} Infant Disposition:{CHL IP OB HOME WITH FNUYZM:76418} Discharge instruction: per After Visit Summary and Postpartum booklet. Activity: Advance as tolerated. Pelvic rest for 6 weeks.  Diet: {OB ipzu:78888878} Future Appointments: Future Appointments  Date Time Provider Department Center  09/15/2024  1:50 PM Constant, Winton, MD [facility] None   Follow up Visit:  Sent to [facility] on 09/11/24:  Please schedule this patient for a In person postpartum visit in 6 weeks with the following provider: Olam Boards. Additional Postpartum F/U:n/a  Low risk pregnancy complicated by: n/a Delivery mode:  Vaginal, Spontaneous Anticipated Birth Control:  POPs   09/11/2024 Olam Boards,  CNM

## 2024-09-11 NOTE — Consult Note (Signed)
 ANTIBIOTIC CONSULT NOTE - INITIAL  Pharmacy Consult for Gentamicin Indication: Chorioamnionitis   Allergies  Allergen Reactions   Amoxicillin Hives    Patient Measurements: Height: 5' 4 (162.6 cm) Weight: 99.5 kg (219 lb 4.8 oz) IBW/kg (Calculated) : 54.7 Adjusted Body Weight: 99.5 kg  Vital Signs: Temp: 100.5 F (38.1 C) (09/27 0429) Temp Source: Axillary (09/27 0429) BP: 136/78 (09/27 0403) Pulse Rate: 113 (09/27 0403)  Labs: Recent Labs    09/10/24 0452 09/10/24 0940 09/10/24 1544  WBC 13.2* 14.4*  --   HGB 14.4 13.7  --   PLT 172 167  --   LABCREA  --   --  68  CREATININE  --  0.55  --    No results for input(s): GENTTROUGH, GENTPEAK, GENTRANDOM in the last 72 hours.   Microbiology: Recent Results (from the past 720 hours)  Culture, beta strep (group b only)     Status: None   Collection Time: 08/23/24  3:47 PM   Specimen: Vaginal/Rectal; Genital   VR  Result Value Ref Range Status   Strep Gp B Culture Negative Negative Final    Comment: Centers for Disease Control and Prevention (CDC) and American Congress of Obstetricians and Gynecologists (ACOG) guidelines for prevention of perinatal group B streptococcal (GBS) disease specify co-collection of a vaginal and rectal swab specimen to maximize sensitivity of GBS detection. Per the CDC and ACOG, swabbing both the lower vagina and rectum substantially increases the yield of detection compared with sampling the vagina alone. Penicillin G, ampicillin, or cefazolin are indicated for intrapartum prophylaxis of perinatal GBS colonization. Reflex susceptibility testing should be performed prior to use of clindamycin only on GBS isolates from penicillin-allergic women who are considered a high risk for anaphylaxis. Treatment with vancomycin without additional testing is warranted if resistance to clindamycin is noted.     Assessment: 23 y.o. female G2P0010 at [redacted]w[redacted]d near delivery  Plan:  Gentamicin 5  mg/kg IV x1   Check Scr with next labs if gentamicin continued. Will check gentamicin levels if continued > 72hr or clinically indicated.  Powell GORMAN Sick 09/11/2024,4:49 AM

## 2024-09-11 NOTE — Progress Notes (Signed)
 Patient ID: Valerie Hughes, female   DOB: [birth year], 23 y.o.   MRN: [id number]  Patient Vitals for the past 4 hrs:  BP Temp Temp src Pulse  09/11/24 0035 -- 98.9 F (37.2 C) Oral --  09/11/24 0032 114/76 -- -- (!) 104  09/10/24 2332 131/69 -- -- 92  09/10/24 2302 119/61 -- -- 88  09/10/24 2231 118/61 -- -- 96  09/10/24 2200 122/74 -- -- 96  09/10/24 2136 -- 98.7 F (37.1 C) Oral --  09/10/24 2131 134/80 -- -- (!) 102  09/10/24 2101 129/76 -- -- MAGNUS 113   Dilation: Lip/rim Effacement (%): 90 Cervical Position: Posterior Station: 0 Presentation: Vertex Exam by:: Vernell CROME SNM  SNM to bedside for cervical exam and possible IUPC placement. Pitocin  infusing at 4 mu/min. Cervix 10cm with thin lip at anterior. FHR Cat 1 at this time. Discussed laboring down vs trial of pushing. Patient would like to labor down until she is feeling more pressure. Placed in throne position by [employer]. Explained if there are concerns for fetal wellbeing we will begin pushing sooner.  Vernell Ruddle, SNM 09/11/2024 12:56 AM

## 2024-09-11 NOTE — Progress Notes (Signed)
 Patient ID: Valerie Hughes, female   DOB: 2001-01-14, 23 y.o.   MRN: [id number]  Patient Vitals for the past 4 hrs:  BP Temp Temp src Pulse  09/11/24 0429 -- (!) 100.5 F (38.1 C) Axillary --  09/11/24 0403 136/78 -- -- (!) 113  09/11/24 0338 -- (!) 100.5 F (38.1 C) Axillary --  09/11/24 0337 -- 99 F (37.2 C) Oral --  09/11/24 0331 134/81 -- -- (!) 109  09/11/24 0300 126/76 -- -- (!) 122  09/11/24 0232 129/86 -- -- (!) 109  09/11/24 0202 (!) 143/90 -- -- (!) 107  09/11/24 0131 (!) 151/92 -- -- (!) 109   Dilation: 10 Dilation Complete Date: 09/11/24 Dilation Complete Time: 0345 Effacement (%): 100 Cervical Position: Posterior Station: Plus 1 Presentation: Vertex Exam by:: Lum Sharps RN  Axillary temp 100.50F. Due to amoxicillin allergy (hives), Ancef and Gentamicin ordered per pharmacy recommendation. Patient has pushing well for approx 1 hour. FHR Cat 2; decels with pushing with moderate variability throughout and quick return to baseline. Anticipate NSVD.   Vernell Ruddle, SNM 09/11/2024 5:08 AM

## 2024-09-11 NOTE — Anesthesia Postprocedure Evaluation (Signed)
 Anesthesia Post Note  Patient: Valerie Hughes  Procedure(s) Performed: AN AD HOC LABOR EPIDURAL     Patient location during evaluation: Mother Baby Anesthesia Type: Epidural Level of consciousness: awake and alert Pain management: pain level controlled Vital Signs Assessment: post-procedure vital signs reviewed and stable Respiratory status: spontaneous breathing, nonlabored ventilation and respiratory function stable Cardiovascular status: stable Postop Assessment: no headache, no backache and epidural receding Anesthetic complications: no   No notable events documented.  Last Vitals:  Vitals:   09/11/24 1055 09/11/24 1603  BP: 120/71 131/69  Pulse: (!) 111 98  Resp:  18  Temp: 36.9 C 36.7 C  SpO2: 100% 99%    Last Pain:  Vitals:   09/11/24 1603  TempSrc: Oral  PainSc:    Pain Goal: Patients Stated Pain Goal: 0 (09/10/24 0407)                 STEEN HOOSE

## 2024-09-12 ENCOUNTER — Other Ambulatory Visit (HOSPITAL_COMMUNITY): Payer: Self-pay

## 2024-09-12 LAB — CBC
HCT: 28.9 % — ABNORMAL LOW (ref 36.0–46.0)
Hemoglobin: 9.9 g/dL — ABNORMAL LOW (ref 12.0–15.0)
MCH: 32.6 pg (ref 26.0–34.0)
MCHC: 34.3 g/dL (ref 30.0–36.0)
MCV: 95.1 fL (ref 80.0–100.0)
Platelets: 127 K/uL — ABNORMAL LOW (ref 150–400)
RBC: 3.04 MIL/uL — ABNORMAL LOW (ref 3.87–5.11)
RDW: 13.7 % (ref 11.5–15.5)
WBC: 20.4 K/uL — ABNORMAL HIGH (ref 4.0–10.5)
nRBC: 0 % (ref 0.0–0.2)

## 2024-09-12 MED ORDER — SLYND 4 MG PO TABS
1.0000 | ORAL_TABLET | Freq: Every day | ORAL | 0 refills | Status: AC
  Filled 2024-09-12: qty 28, 28d supply, fill #0

## 2024-09-12 MED ORDER — ACETAMINOPHEN 500 MG PO TABS
1000.0000 mg | ORAL_TABLET | Freq: Four times a day (QID) | ORAL | 0 refills | Status: AC | PRN
  Filled 2024-09-12: qty 60, 8d supply, fill #0

## 2024-09-12 MED ORDER — IBUPROFEN 600 MG PO TABS
600.0000 mg | ORAL_TABLET | Freq: Four times a day (QID) | ORAL | 0 refills | Status: AC
  Filled 2024-09-12: qty 30, 8d supply, fill #0

## 2024-09-12 MED ORDER — SENNOSIDES-DOCUSATE SODIUM 8.6-50 MG PO TABS
2.0000 | ORAL_TABLET | Freq: Every day | ORAL | 0 refills | Status: AC
  Filled 2024-09-12: qty 60, 30d supply, fill #0

## 2024-09-12 MED ORDER — FERROUS SULFATE 325 (65 FE) MG PO TABS
325.0000 mg | ORAL_TABLET | Freq: Every day | ORAL | 0 refills | Status: AC
  Filled 2024-09-12: qty 100, 100d supply, fill #0

## 2024-09-12 MED ORDER — FERROUS SULFATE 325 (65 FE) MG PO TABS
325.0000 mg | ORAL_TABLET | Freq: Every day | ORAL | Status: DC
  Administered 2024-09-12 – 2024-09-13 (×2): 325 mg via ORAL
  Filled 2024-09-12 (×2): qty 1

## 2024-09-12 NOTE — Progress Notes (Signed)
 POSTPARTUM PROGRESS NOTE  Post Partum Day 1  Subjective:  Valerie Hughes is a 23 y.o. G2P0010 s/p SVD at [redacted]w[redacted]d. Complicated by cervical laceration and triple I.  She reports she is doing well. No acute events overnight. She denies any problems with ambulating, voiding or po intake. Denies nausea or vomiting.  Pain is well controlled.  Lochia is Normal.  Objective: Blood pressure 108/61, pulse 93, temperature 97.9 F (36.6 C), temperature source Oral, resp. rate 18, height 5' 4 (1.626 m), weight 99.5 kg, last menstrual period 11/28/2023, SpO2 99%.  BP Readings from Last 3 Encounters:  09/12/24 108/61  09/09/24 120/83  09/03/24 116/80    Physical Exam:  General: alert, cooperative and no distress Chest: no respiratory distress Heart:regular rate, distal pulses intact Uterine Fundus: firm, appropriately tender DVT Evaluation: No calf swelling or tenderness Extremities: non-pitting edema Skin: warm, dry  Recent Labs    09/11/24 0750 09/12/24 0509  HGB 11.8* 9.9*  HCT 34.2* 28.9*    Assessment/Plan: Valerie Hughes is a 23 y.o. G2P0010 s/p NSVD at [redacted]w[redacted]d   PPD# 1 - Doing well  Routine postpartum care  Delivery Complications: anemia, triple I, and cervical laceration Blood Pressure: normal Anemia/Hb Status: Low, start PO ferrous sulfate Started on oral iron therapy for asymptomatic but clinically significant postpartum acute blood loss anemia. Contraception: Progesterone-only Oral Contraceptive Pills Feeding: bottle feeding   Dispo: Plan for discharge today or tomorrow depending on baby's disposition today.   LOS: 2 days   Leeroy KATHEE Pouch, MD OB Fellow  09/12/2024, 6:58 AM

## 2024-09-12 NOTE — Lactation Note (Signed)
 This note was copied from a baby's chart. Lactation Consultation Note  Patient Name: Valerie Hughes Unijb'd Date: 09/12/2024 Age:23 hours   Pump: Received Stork Pump (Spectra  DEBP not available today and per mom ok with the DEBP Medela)       [name] 09/12/2024, 3:40 PM

## 2024-09-12 NOTE — Lactation Note (Signed)
 This note was copied from a baby's chart. Lactation Consultation Note  Patient Name: Valerie Hughes Today's Date: 09/12/2024 Age:23 hours   Attempted to see mom but she stated she would call for next feeding.  Maternal Data    Feeding Nipple Type: Slow - flow  LATCH Score                    Lactation Tools Discussed/Used    Interventions    Discharge    Consult Status      Baili Stang G 09/12/2024, 1:16 AM

## 2024-09-12 NOTE — Lactation Note (Signed)
 This note was copied from a baby's chart. Lactation Consultation Note  Patient Name: Valerie Hughes Unijb'd Date: 09/12/2024 Age:23 hours Reason for consult: Initial assessment;Term;Primapara  P1. Mom stated it was time for a feeding. Baby was sleeping. LC un-swaddled baby and checked diaper. It was dry. Set pillows up and got baby latched. She suckled a few times then stopped. Baby sleeping, doesn't appear to be interested in BF. Stimulation done, baby cont. Not to be interested in BF. Mom suggested sleeping since baby was sleeping. Newborn feeding habits, STS, I&O, positioning, body alignment reviewed. Mom encouraged to feed baby 8-12 times/24 hours and with feeding cues.  Mom stated she can latch baby to the Rt. Breast better because she has more milk in that breast. Baby latched well just wasn't that interested in feeding right now. Encouraged mom to call for assistance as needed.  Maternal Data Has patient been taught Hand Expression?: Yes Does the patient have breastfeeding experience prior to this delivery?: No  Feeding Nipple Type: Slow - flow  LATCH Score Latch: Too sleepy or reluctant, no latch achieved, no sucking elicited.  Audible Swallowing: None  Type of Nipple: Everted at rest and after stimulation  Comfort (Breast/Nipple): Soft / non-tender  Hold (Positioning): Assistance needed to correctly position infant at breast and maintain latch.  LATCH Score: 5   Lactation Tools Discussed/Used    Interventions Interventions: Breast feeding basics reviewed;Assisted with latch;Skin to skin;Breast massage;Hand express;Breast compression;Adjust position;Support pillows;Position options;Education;LC Services brochure  Discharge    Consult Status Consult Status: Follow-up Date: 09/12/24 Follow-up type: In-patient    Alawna Graybeal G 09/12/2024, 1:51 AM

## 2024-09-13 NOTE — Lactation Note (Signed)
 This note was copied from a baby's chart. Lactation Consultation Note  Patient Name: Valerie Hughes Unijb'd Date: 09/13/2024 Age:23 hours Reason for consult: Maternal discharge;1st time breastfeeding;Term  C/O limited feedings at breast, only latch 2-3 minutes.  MOB was set up with DEBP and expressing colostrum fitted with 18 m breast flange and given Medela DEBP -STORK. MOB informed LC she has friend that will be assisting  her with latching infant at the breast once she is discharge from the hospital, her friend exclusively breastfeed her children and she is comfortable with her. MOB declined Outpatient LC support. MOB will continue to use the DEBP to help establish her milk supply as she continue to work towards infant latching longer at the breast and  infant sustaining her latch. MOB has been seen three times from [facility] services but infant was recently feed formula during these times,.LC's were unable to assist with latch  infant at the breast at theses times of consult. LC reviewed Community Resources if MOB decides she would like breastfeeding assistance, LC hotline, LC breastfeeding support group and LC outpatient clinic.   MOB understands that her EBM is safe for 4 hours at room temperature where as, RTF formula once open only safe for 1 hour at room temperature.  Current Feeding Plan Day 3 of life: 1- MOB will continue to latch infant first for every feeding, by cues, 8-12 times within 24 hours, skin to skin. 2- MOB knows if infant is breastfeeding 15 -20 minutes, if she wants to supplement infant  to offer 18-25 mls per feeding but if infant does not latch to offer 30-60 mls per feeding of EBM/Formula. 3- MOB will continue to pump with her Medela DEBP to help stimulate and establish her milk supply as she continues to work on infant's latch.    LC discharge education: 1- LC discussed engorgement treatment and prevention. 2- LC discussed warning signs of dehydration in infant. 3- LC  discussed how to know if breastfeeding is going well.  Maternal Data    Feeding Mother's Current Feeding Choice: Breast Milk and Formula Nipple Type: Slow - flow  LATCH Score  LC unable assist with latching  infant  at the breast due to infant being given formula prior to [facility] entering the room. Less than 1 hour ago.                  Lactation Tools Discussed/Used Tools: Flanges;Pump Flange Size: 18 Pump Education: Setup, frequency, and cleaning;Milk Storage Reason for Pumping: MOB will continue to use the DEBP until infant is improving with her latch. Pumping frequency: MOB will continue to pump every 3 hours at home with her STORK DEBP medela .  Interventions Interventions: CDC Guidelines for Breast Pump Cleaning;CDC milk storage guidelines;Guidelines for Milk Supply and Pumping Schedule Handout;LC Services brochure;Education  Discharge Discharge Education: Engorgement and breast care;Warning signs for feeding baby Pump: Personal;Manual;Received Stork Pump  Consult Status Consult Status: Complete Date: 09/13/24 Follow-up type: Physician    Grayce LULLA Batter 09/13/2024, 11:53 AM

## 2024-09-13 NOTE — Patient Instructions (Signed)
 If interested in an outpatient lactation consult in office or virtually please reach out to us  at [facility] (First Floor) [address] [place]  Please feel free to out with any lactation related questions or concerns [phone]  to leave a message for our lactation voicemail box.  Lactation support groups:  [facility], Tuesdays 10:00 am -12:00 pm at [address] on the second floor in the conference room, lactating parents and lap babies welcome.  [email or web address]  [email or web address]      Valerie Hughes, [facility]

## 2024-09-15 ENCOUNTER — Encounter: Admitting: Obstetrics and Gynecology

## 2024-09-15 LAB — SURGICAL PATHOLOGY

## 2024-09-18 ENCOUNTER — Inpatient Hospital Stay (HOSPITAL_COMMUNITY)

## 2024-09-18 ENCOUNTER — Inpatient Hospital Stay (HOSPITAL_COMMUNITY): Admission: RE | Admit: 2024-09-18 | Source: Home / Self Care | Admitting: Obstetrics and Gynecology

## 2024-09-21 ENCOUNTER — Telehealth (HOSPITAL_COMMUNITY): Payer: Self-pay | Admitting: *Deleted

## 2024-09-21 NOTE — Telephone Encounter (Signed)
 09/21/2024  Name: Espyn Radwan MRN: [id number] DOB: 2001-04-14  Reason for Call:  Transition of Care Hospital Discharge Call  Contact Status: Patient Contact Status: Complete  Language assistant needed: Interpreter Mode: Interpreter Not Needed        Follow-Up Questions: Do You Have Any Concerns About Your Health As You Heal From Delivery?: No Do You Have Any Concerns About Your Infants Health?: No  Edinburgh Postnatal Depression Scale:  In the Past 7 Days:    PHQ2-9 Depression Scale:     Discharge Follow-up: Edinburgh score requires follow up?:  (Patient says answers are the same as in the hospital when score was 0.  Endorses she is doing well emotionally.) Patient was advised of the following resources:: Breastfeeding Support Group, Support Group  Post-discharge interventions: Reviewed Newborn Safe Sleep Practices  Mliss Sieve, RN 09/21/2024 11:10

## 2024-09-27 ENCOUNTER — Encounter: Payer: Self-pay | Admitting: Physician Assistant

## 2024-09-28 ENCOUNTER — Telehealth: Payer: Self-pay

## 2024-09-28 NOTE — Telephone Encounter (Signed)
 Called pt after receiving mychart message. Advised pt that after consulting with Olam in the office, she can soak in bath or let stitch come out on its own unless it is causing discomfort, then she has the option to come in. Pt states that it is not bothering her so she will wait, advised to let us  know if anything changes.

## 2024-10-19 ENCOUNTER — Ambulatory Visit (INDEPENDENT_AMBULATORY_CARE_PROVIDER_SITE_OTHER): Admitting: Advanced Practice Midwife

## 2024-10-19 ENCOUNTER — Encounter: Payer: Self-pay | Admitting: Advanced Practice Midwife

## 2024-10-19 NOTE — Progress Notes (Signed)
 Post Partum Visit Note  Valerie Hughes is a 23 y.o. G50P1011 female who presents for a postpartum visit. She is 5 weeks postpartum following a normal spontaneous vaginal delivery.  I have fully reviewed the prenatal and intrapartum course. The delivery was at [redacted]w[redacted]d gestational weeks.  Anesthesia: epidural. Postpartum course has been good. Baby is doing well. Baby is feeding by both breast and bottle - Similac with Iron- Total Comfort. Bleeding no bleeding. Bowel function is normal. Bladder function is normal. Patient is not sexually active. Contraception method is oral progesterone-only contraceptive. Postpartum depression screening: negative.   The pregnancy intention screening data noted above was reviewed. Potential methods of contraception were discussed. The patient elected to proceed with No data recorded.   Edinburgh Postnatal Depression Scale - 10/19/24 0934       Edinburgh Postnatal Depression Scale:  In the Past 7 Days   I have been able to laugh and see the funny side of things. 0    I have looked forward with enjoyment to things. 0    I have blamed myself unnecessarily when things went wrong. 0    I have been anxious or worried for no good reason. 2    I have felt scared or panicky for no good reason. 0    Things have been getting on top of me. 0    I have been so unhappy that I have had difficulty sleeping. 1    I have felt sad or miserable. 0    I have been so unhappy that I have been crying. 1    The thought of harming myself has occurred to me. 0    Edinburgh Postnatal Depression Scale Total 4          Health Maintenance Due  Topic Date Due   HPV VACCINES (1 - 3-dose series) Never done   Meningococcal B Vaccine (1 of 2 - Standard) Never done   Influenza Vaccine  07/16/2024   COVID-19 Vaccine (3 - 2025-26 season) 08/16/2024    The following portions of the patient's history were reviewed and updated as appropriate: allergies, current medications, past family  history, past medical history, past social history, past surgical history, and problem list.  Review of Systems A comprehensive review of systems was negative.  Objective:  BP 123/84   Pulse 99   Ht 5' 3.25 (1.607 m)   Wt 205 lb (93 kg)   LMP 11/28/2023   Breastfeeding Unknown   BMI 36.03 kg/m    VS reviewed, nursing note reviewed,  Constitutional: well developed, well nourished, no distress HEENT: normocephalic CV: normal rate Pulm/chest wall: normal effort Abdomen: soft Neuro: alert and oriented x 3 Skin: warm, dry Psych: affect normal  Assessment:   1. Postpartum care following vaginal delivery (Primary) --Doing well, bonding well with baby, going back to work on Sunday so concerns but doing well. Pt working with partner to share nighttime childcare.   --Breastfeeding is going well --Reviewed resources if pt needs counseling postpartum   Plan:   Essential components of care per ACOG recommendations:  1.  Mood and well being: Patient with negative depression screening today. Reviewed local resources for support.  - Patient tobacco use? No.   - hx of drug use? No.    2. Infant care and feeding:  -Patient currently breastmilk feeding? Yes. Reviewed importance of draining breast regularly to support lactation.  -Social determinants of health (SDOH) reviewed in EPIC. No concerns   3. Sexuality, contraception  and birth spacing - Patient does not want a pregnancy in the next year.  - Reviewed reproductive life planning. Reviewed contraceptive methods based on pt preferences and effectiveness.  Patient desired Oral Contraceptive today.   - Discussed birth spacing of 18 months  4. Sleep and fatigue -Encouraged family/partner/community support of 4 hrs of uninterrupted sleep to help with mood and fatigue  5. Physical Recovery  - Discussed patients delivery and complications. She describes her labor as good. - Patient had a Vaginal, no problems at delivery. Patient had  a cervical laceration. No complications since discharge from hospital.  - Patient has urinary incontinence? No. - Patient is safe to resume physical and sexual activity  6.  Health Maintenance - HM due items addressed Yes - Last pap smear  Diagnosis  Date Value Ref Range Status  06/11/2023 (A)  Final   - Atypical squamous cells of undetermined significance (ASC-US )   Pap smear not done at today's visit.  -Breast Cancer screening indicated? No.   7. Chronic Disease/Pregnancy Condition follow up: None  - PCP follow up  Olam Boards, CNM Center for [employer], [facility]

## 2024-11-19 ENCOUNTER — Encounter: Payer: Self-pay | Admitting: Advanced Practice Midwife
